# Patient Record
Sex: Female | Born: 1955 | Race: White | Hispanic: No | Marital: Married | Smoking: Never smoker
Health system: Southern US, Community
[De-identification: ages and names within clinical notes are randomized; demographics above are authoritative.]

## PROBLEM LIST (undated history)

## (undated) DIAGNOSIS — I10 Essential (primary) hypertension: Secondary | ICD-10-CM

## (undated) DIAGNOSIS — E119 Type 2 diabetes mellitus without complications: Secondary | ICD-10-CM

## (undated) DIAGNOSIS — E785 Hyperlipidemia, unspecified: Secondary | ICD-10-CM

## (undated) HISTORY — PX: ABDOMINOPLASTY: SUR9

## (undated) HISTORY — PX: ABDOMINAL HYSTERECTOMY: SHX81

## (undated) HISTORY — PX: KNEE SURGERY: SHX244

---

## 2009-03-22 ENCOUNTER — Ambulatory Visit: Payer: Self-pay | Admitting: Family Medicine

## 2009-03-22 DIAGNOSIS — M546 Pain in thoracic spine: Secondary | ICD-10-CM | POA: Insufficient documentation

## 2009-03-22 DIAGNOSIS — I10 Essential (primary) hypertension: Secondary | ICD-10-CM | POA: Insufficient documentation

## 2009-03-22 DIAGNOSIS — E109 Type 1 diabetes mellitus without complications: Secondary | ICD-10-CM | POA: Insufficient documentation

## 2009-03-22 DIAGNOSIS — M542 Cervicalgia: Secondary | ICD-10-CM | POA: Insufficient documentation

## 2009-03-22 DIAGNOSIS — E785 Hyperlipidemia, unspecified: Secondary | ICD-10-CM | POA: Insufficient documentation

## 2009-04-02 ENCOUNTER — Ambulatory Visit: Payer: Self-pay | Admitting: Family Medicine

## 2009-04-02 DIAGNOSIS — J40 Bronchitis, not specified as acute or chronic: Secondary | ICD-10-CM | POA: Insufficient documentation

## 2009-06-10 ENCOUNTER — Ambulatory Visit: Payer: Self-pay | Admitting: Family Medicine

## 2009-06-10 DIAGNOSIS — R42 Dizziness and giddiness: Secondary | ICD-10-CM | POA: Insufficient documentation

## 2009-06-10 DIAGNOSIS — R110 Nausea: Secondary | ICD-10-CM | POA: Insufficient documentation

## 2009-06-10 DIAGNOSIS — J019 Acute sinusitis, unspecified: Secondary | ICD-10-CM | POA: Insufficient documentation

## 2009-06-10 DIAGNOSIS — G4730 Sleep apnea, unspecified: Secondary | ICD-10-CM | POA: Insufficient documentation

## 2010-02-12 ENCOUNTER — Ambulatory Visit
Admission: RE | Admit: 2010-02-12 | Discharge: 2010-02-12 | Payer: Self-pay | Source: Home / Self Care | Admitting: Emergency Medicine

## 2010-02-16 NOTE — Letter (Signed)
Summary: Out of Work  [facility]  [address]   [place], [place] [address]   Phone: [phone]  Fax: [phone]    Jun 10, 2009   Employee:  Cyndi SHONTEL SANTEE    To Whom It May Concern:   For Medical reasons, please excuse the above named employee from work for the following dates:  Start:   06/10/2009  Return:   06/13/2009  If you need additional information, please feel free to contact our office.         Sincerely,    Hassan Rowan MD

## 2010-02-16 NOTE — Assessment & Plan Note (Signed)
Summary: COUGHING IS PERSISTANT/WHEEZING   Vital Signs:  Patient Profile:   55 Years Old Female CC:      runny nose, productive cough X 1 week, chills X 4 days and SOB at rest and with exertion  Height:     64.5 inches Weight:      200 pounds O2 Sat:      99 % O2 treatment:    Room Air Temp:     99.9 degrees F oral Pulse rate:   98 / minute Pulse rhythm:   regular Resp:     16 per minute BP sitting:   141 / 94  (right arm)  Pt. in pain?   no  Vitals Entered By: Lajean Saver RN (April 02, 2009 4:09 PM)                   Updated Prior Medication List: HYDROCHLOROTHIAZIDE 25 MG TABS (HYDROCHLOROTHIAZIDE) 1 tab by mouth once daily METFORMIN HCL 500 MG TABS (METFORMIN HCL) 2 tabs by mouth once daily GLIPIZIDE 2.5 MG XR24H-TAB (GLIPIZIDE) 1 tab by mouth three times a day SIMVASTATIN 40 MG TABS (SIMVASTATIN) 1 tab by mouth once daily LORTAB 5 5-500 MG TABS (HYDROCODONE-ACETAMINOPHEN) One or two tabs by mouth hs as needed pain LISINOPRIL 5 MG TABS (LISINOPRIL) once daily MUCINEX DM 30-600 MG XR12H-TAB (DEXTROMETHORPHAN-GUAIFENESIN) taken as needed X 3 days  Current Allergies (reviewed today): ! PENICILLIN ! CODEINEHistory of Present Illness Chief Complaint: runny nose, productive cough X 1 week, chills X 4 days and SOB at rest and with exertion  History of Present Illness: ONSET A WK AGO WITH RUNNY NOSE AND PND. INITIALLY A NP CROUPY COUGH THAT HAS NOW BECOME PRODUCTIVE. ?FEVER, HAS CHILLS. HX OF PNEUMONIZ IN THE PAST X 2. TAKING OTC MEDICATION.   REVIEW OF SYSTEMS Constitutional Symptoms       Complains of chills.     Denies fever, night sweats, weight loss, weight gain, and fatigue.  Eyes       Denies change in vision, eye pain, eye discharge, glasses, contact lenses, and eye surgery. Ear/Nose/Throat/Mouth       Complains of frequent nose bleeds.      Denies hearing loss/aids, change in hearing, ear pain, ear discharge, dizziness, frequent runny nose, sinus problems, sore  throat, hoarseness, and tooth pain or bleeding.  Respiratory       Complains of productive cough and shortness of breath.      Denies dry cough, wheezing, asthma, bronchitis, and emphysema/COPD.  Cardiovascular       Denies murmurs, chest pain, and tires easily with exhertion.    Gastrointestinal       Denies stomach pain, nausea/vomiting, diarrhea, constipation, blood in bowel movements, and indigestion. Genitourniary       Denies painful urination, kidney stones, and loss of urinary control. Neurological       Denies paralysis, seizures, and fainting/blackouts. Musculoskeletal       Denies muscle pain, joint pain, joint stiffness, decreased range of motion, redness, swelling, muscle weakness, and gout.  Skin       Denies bruising, unusual mles/lumps or sores, and hair/skin or nail changes.  Psych       Denies mood changes, temper/anger issues, anxiety/stress, speech problems, depression, and sleep problems. Other Comments: Taken mucinex DM X 3 days and Zyrtec   Past History:  Past Medical History: Reviewed history from 03/22/2009 and no changes required. Diabetes mellitus, type I Hyperlipidemia Hypertension Sleep Apnea  Past Surgical History: Reviewed history  from 03/22/2009 and no changes required. Caesarean section Hysterectomy Tummy Tuck  Family History: Reviewed history from 03/22/2009 and no changes required. Family History of CAD Female 1st degree relative <60 Family History High cholesterol Family History Hypertension Family History Lung cancer Family History of Stroke M 1st degree relative <50  Social History: Never Smoked Alcohol use-no Drug use-no Regular exercise-no Occupation: Runner, broadcasting/film/video Married Physical Exam General appearance: well developed, well nourished, no acute distress Ears: normal, no lesions or deformities Nasal: mucosa pink, nonedematous, no septal deviation, turbinates normal Neck: neck supple,  trachea midline, no masses Chest/Lungs: no  rales, wheezes, or rhonchi bilateral, breath sounds equal without effort. CROUPY COUGH Heart: regular rate and  rhythm, no murmur Extremities: normal extremities Skin: no obvious rashes or lesions Assessment New Problems: BRONCHITIS (ICD-490)   Plan New Medications/Changes: HYDROCODONE-ACETAMINOPHEN 5-500 MG TABS (HYDROCODONE-ACETAMINOPHEN) 1/2 TO 1 TAB by mouth Q 6-8 HRS as needed COUGH  ##15 x 0, 04/02/2009, Mahdi Frye DO ZITHROMAX Z-PAK 250 MG TABS (AZITHROMYCIN) TAKE AS DIRECTED  #1 PK x 0, 04/02/2009, Marvis Moeller DO  New Orders: Est. Patient Level III [35573]   Prescriptions: HYDROCODONE-ACETAMINOPHEN 5-500 MG TABS (HYDROCODONE-ACETAMINOPHEN) 1/2 TO 1 TAB by mouth Q 6-8 HRS as needed COUGH  ##15 x 0   Entered and Authorized by:   Marvis Moeller DO   Signed by:   Marvis Moeller DO on 04/02/2009   Method used:   Print then Give to Patient   RxID:   [id number] ZITHROMAX Z-PAK 250 MG TABS (AZITHROMYCIN) TAKE AS DIRECTED  #1 PK x 0   Entered and Authorized by:   Marvis Moeller DO   Signed by:   Marvis Moeller DO on 04/02/2009   Method used:   Print then Give to Patient   RxID:   [id number]   Patient Instructions: 1)  TYLENOL OR MOTRIN AS NEEDED. AVOID CAFFEINE AND MILK PRODUCTS. FOLLOW UP WITH YOUR PCP OR RETURN IF SYMPTOMS PERSIST OR WORSEN.

## 2010-02-16 NOTE — Letter (Signed)
Summary: Out of Work  [facility]  [address]   [place], [place] [address]   Phone: [phone]  Fax: [phone]    April 02, 2009   Employee:  Katherine Luna    To Whom It May Concern:   For Medical reasons, please excuse the above named employee from work for the following dates:  Start:   02 Apr 2009  End:   05 Apr 2009  If you need additional information, please feel free to contact our office.         Sincerely,    Marvis Moeller DO

## 2010-02-16 NOTE — Assessment & Plan Note (Signed)
Summary: Dizziness, eye pain, sinus pressure x 1 wk rm 2   Vital Signs:  Patient Profile:   55 Years Old Female CC:      Cold & URI symptoms Height:     64.5 inches Weight:      202 pounds O2 Sat:      100 % O2 treatment:    Room Air Temp:     97.7 degrees F oral Pulse rate:   78 / minute Pulse rhythm:   regular Resp:     16 per minute BP sitting:   124 / 82  (right arm) Cuff size:   regular  Vitals Entered By: Areta Haber CMA (Jun 10, 2009 7:08 PM)                  Prior Medication List:  HYDROCHLOROTHIAZIDE 25 MG TABS (HYDROCHLOROTHIAZIDE) 1 tab by mouth once daily METFORMIN HCL 500 MG TABS (METFORMIN HCL) 2 tabs by mouth once daily GLIPIZIDE 2.5 MG XR24H-TAB (GLIPIZIDE) 1 tab by mouth three times a day SIMVASTATIN 40 MG TABS (SIMVASTATIN) 1 tab by mouth once daily LORTAB 5 5-500 MG TABS (HYDROCODONE-ACETAMINOPHEN) One or two tabs by mouth hs as needed pain LISINOPRIL 5 MG TABS (LISINOPRIL) once daily MUCINEX DM 30-600 MG XR12H-TAB (DEXTROMETHORPHAN-GUAIFENESIN) taken as needed X 3 days ZITHROMAX Z-PAK 250 MG TABS (AZITHROMYCIN) TAKE AS DIRECTED HYDROCODONE-ACETAMINOPHEN 5-500 MG TABS (HYDROCODONE-ACETAMINOPHEN) 1/2 TO 1 TAB by mouth Q 6-8 HRS as needed COUGH   Current Allergies (reviewed today): ! PENICILLIN ! CODEINE   History of Present Illness Chief Complaint: Cold & URI symptoms History of Present Illness: Patient started having dizzzyness today this AM. she reports having vertigo 11 months ago. She has been taking her antivert which has helped to some degree. She states she has been having sinus drainage and puffiness abiout her face as well.   Current Problems: SLEEP APNEA, CHRONIC (ICD-780.57) NAUSEA ALONE (ICD-787.02) VERTIGO (ICD-780.4) ACUTE SINUSITIS, UNSPECIFIED (ICD-461.9) BRONCHITIS (ICD-490) BACK PAIN, THORACIC REGION, LEFT (ICD-724.1) NECK PAIN, ACUTE (ICD-723.1) FAMILY HISTORY OF CAD FEMALE 1ST DEGREE RELATIVE <60  (ICD-V16.49) HYPERTENSION (ICD-401.9) HYPERLIPIDEMIA (ICD-272.4) DIABETES MELLITUS, TYPE I (ICD-250.01)   Current Meds HYDROCHLOROTHIAZIDE 25 MG TABS (HYDROCHLOROTHIAZIDE) 1 tab by mouth once daily METFORMIN HCL 500 MG TABS (METFORMIN HCL) 2 tabs by mouth once daily GLIPIZIDE 2.5 MG XR24H-TAB (GLIPIZIDE) 1 tab by mouth three times a day SIMVASTATIN 40 MG TABS (SIMVASTATIN) 1 tab by mouth once daily LORTAB 5 5-500 MG TABS (HYDROCODONE-ACETAMINOPHEN) One or two tabs by mouth hs as needed pain LISINOPRIL 5 MG TABS (LISINOPRIL) once daily MUCINEX DM 30-600 MG XR12H-TAB (DEXTROMETHORPHAN-GUAIFENESIN) taken as needed X 3 days MECLIZINE HCL 25 MG TABS (MECLIZINE HCL) 1 tab q 4 hrs prns CEFUROXIME AXETIL 500 MG  TABS (CEFUROXIME AXETIL) 1 by mouth 2 times daily * NASONEX OR FLONASE NASAL SPRAY sig 2 puffs each nostril QDay PROMETHAZINE HCL 25 MG  TABS (PROMETHAZINE HCL) sig 1 by mouth q6-8hrs as needed for nausea MUCINEX D [phone] MG XR12H-TAB (PSEUDOEPHEDRINE-GUAIFENESIN) sig 1 by mouth twice a day as needed for nassal congestion MECLIZINE HCL 25 MG TABS (MECLIZINE HCL) 1 by mouth two three times a day as needed vertigo  REVIEW OF SYSTEMS Constitutional Symptoms      Denies fever, chills, night sweats, weight loss, weight gain, and fatigue.  Eyes       Complains of eye pain.      Denies change in vision, eye discharge, glasses, contact lenses, and eye surgery. Ear/Nose/Throat/Mouth  Complains of ear pain, dizziness, and sinus problems.      Denies hearing loss/aids, change in hearing, ear discharge, frequent runny nose, frequent nose bleeds, sore throat, hoarseness, and tooth pain or bleeding.      Comments: x 1 wk Respiratory       Complains of dry cough.      Denies productive cough, wheezing, shortness of breath, asthma, bronchitis, and emphysema/COPD.  Cardiovascular       Denies murmurs, chest pain, and tires easily with exhertion.    Gastrointestinal       Denies stomach pain,  nausea/vomiting, diarrhea, constipation, blood in bowel movements, and indigestion. Genitourniary       Denies painful urination, kidney stones, and loss of urinary control. Neurological       Denies paralysis, seizures, and fainting/blackouts. Musculoskeletal       Denies muscle pain, joint pain, joint stiffness, decreased range of motion, redness, swelling, muscle weakness, and gout.  Skin       Denies bruising, unusual mles/lumps or sores, and hair/skin or nail changes.  Psych       Denies mood changes, temper/anger issues, anxiety/stress, speech problems, depression, and sleep problems. Other Comments: Pt states dizziness started this am. Pt has not seen PCP for this.    Past History:  Past Medical History: Last updated: 03/22/2009 Diabetes mellitus, type I Hyperlipidemia Hypertension Sleep Apnea  Past Surgical History: Last updated: 03/22/2009 Caesarean section Hysterectomy Tummy Tuck  Family History: Last updated: 03/22/2009 Family History of CAD Female 1st degree relative <60 Family History High cholesterol Family History Hypertension Family History Lung cancer Family History of Stroke M 1st degree relative <50  Social History: Last updated: 04/02/2009 Never Smoked Alcohol use-no Drug use-no Regular exercise-no Occupation: teacher Married  Risk Factors: Exercise: no (03/22/2009)  Risk Factors: Smoking Status: never (03/22/2009)  Family History: Reviewed history from 03/22/2009 and no changes required. Family History of CAD Female 1st degree relative <60 Family History High cholesterol Family History Hypertension Family History Lung cancer Family History of Stroke M 1st degree relative <50  Social History: Reviewed history from 04/02/2009 and no changes required. Never Smoked Alcohol use-no Drug use-no Regular exercise-no Occupation: Runner, broadcasting/film/video Married Physical Exam General appearance: well developed, well nourished,mild  distress Head:  normocephalic, atraumatic Ears: fluid noted without inflammation right TM Nasal: swollen red turbinates with congestion tenderness over the sinuses Oral/Pharynx: pharyngeal erythema without exudate, uvula midline without deviation Neck: supple,anterior lymphadenopathy present Skin: no obvious rashes or lesions MSE: oriented to time, place, and person Assessment New Problems: SLEEP APNEA, CHRONIC (ICD-780.57) NAUSEA ALONE (ICD-787.02) VERTIGO (ICD-780.4) ACUTE SINUSITIS, UNSPECIFIED (ICD-461.9)  sinusitis   vertigo  Patient Education: Patient and/or caregiver instructed in the following: rest fluids and Tylenol.  Plan New Medications/Changes: MECLIZINE HCL 25 MG TABS (MECLIZINE HCL) 1 by mouth two three times a day as needed vertigo  #20 x 0, 06/10/2009, Hassan Rowan MD MUCINEX D [phone] MG XR12H-TAB (PSEUDOEPHEDRINE-GUAIFENESIN) sig 1 by mouth twice a day as needed for nassal congestion  #30 x 0, 06/10/2009, Hassan Rowan MD PROMETHAZINE HCL 25 MG  TABS (PROMETHAZINE HCL) sig 1 by mouth q6-8hrs as needed for nausea  #12 x 0, 06/10/2009, Hassan Rowan MD NASONEX OR [facility] NASAL SPRAY sig 2 puffs each nostril QDay  #1 x 0, 06/10/2009, Hassan Rowan MD CEFUROXIME AXETIL 500 MG  TABS (CEFUROXIME AXETIL) 1 by mouth 2 times daily  #20 x 0, 06/10/2009, Hassan Rowan MD  New Orders: Est. Patient Level III [phone] Planning  Comments:   wil treat for eustatian tube dysfunction and sinusitis  Follow Up: Follow up in 2-3 days if no improvement, Follow up with Primary Physician Work/School Excuse: Return to work/school in 3 days  The patient and/or caregiver has been counseled thoroughly with regard to medications prescribed including dosage, schedule, interactions, rationale for use, and possible side effects and they verbalize understanding.  Diagnoses and expected course of recovery discussed and will return if not improved as expected or if the condition worsens. Patient and/or caregiver verbalized  understanding.  Prescriptions: MECLIZINE HCL 25 MG TABS (MECLIZINE HCL) 1 by mouth two three times a day as needed vertigo  #20 x 0   Entered and Authorized by:   Hassan Rowan MD   Signed by:   Hassan Rowan MD on 06/10/2009   Method used:   Printed then faxed to ...       [facility]  [address] [phone]* (retail)       [address] [place], [place]  [address]       Ph: [phone] or [phone]       Fax: [phone]   RxID:   [id number] MUCINEX D [phone] MG XR12H-TAB (PSEUDOEPHEDRINE-GUAIFENESIN) sig 1 by mouth twice a day as needed for nassal congestion  #30 x 0   Entered and Authorized by:   Hassan Rowan MD   Signed by:   Hassan Rowan MD on 06/10/2009   Method used:   Printed then faxed to ...       [facility]  [address] [phone]* (retail)       [address] [place], [place]  [address]       Ph: [phone] or [phone]       Fax: [phone]   RxID:   [id number] PROMETHAZINE HCL 25 MG  TABS (PROMETHAZINE HCL) sig 1 by mouth q6-8hrs as needed for nausea  #12 x 0   Entered and Authorized by:   Hassan Rowan MD   Signed by:   Hassan Rowan MD on 06/10/2009   Method used:   Printed then faxed to ...       [facility]  [address] [phone]* (retail)       [address] [place], [place]  [address]       Ph: [phone] or [phone]       Fax: [phone]   RxID:   [id number] NASONEX OR FLONASE NASAL SPRAY sig 2 puffs each nostril QDay  #1 x 0   Entered and Authorized by:   Hassan Rowan MD   Signed by:   Hassan Rowan MD on 06/10/2009   Method used:   Printed then faxed to ...       [facility]  [address] [phone]* (retail)       [address] [place], [place]  [address]       Ph: [phone] or [phone]       Fax: [phone]   RxID:   [id number] CEFUROXIME AXETIL 500 MG  TABS (CEFUROXIME AXETIL) 1 by mouth 2 times daily  #20 x 0   Entered and Authorized by:   Hassan Rowan MD   Signed by:   Hassan Rowan MD on 06/10/2009   Method used:   Printed then faxed  to ...       [facility]  [address] [phone]* (retail)  [address]       [place], [place]  [address]       Ph: [phone] or [phone]       Fax: [phone]   RxID:   [id number]   Patient Instructions: 1)  Please schedule an appointment with your primary doctor in : 2)   a follow-up appointment in3-14 days if needed 3)  Take your antibiotic as prescribed until ALL of it is gone, but stop if you develop a rash or swelling and contact our office as soon as possible. 4)  Acute sinusitis symptoms for less than 10 days are not helped by antibiotics.Use warm moist compresses, and over the counter decongestants ( only as directed). Call if no improvement in 5-7 days, sooner if increasing pain, fever, or new symptoms. 5)  Recommended remaining out of work until 06/13/2009

## 2010-02-16 NOTE — Assessment & Plan Note (Signed)
Summary: Shoulder/neck pain x 3 weeks rm 3   Vital Signs:  Patient Profile:   55 Years Old Female CC:      Shoulder, neck pain- Left x 3 wks Height:     64.5 inches Weight:      200 pounds O2 Sat:      100 % O2 treatment:    Room Air Temp:     98.0 degrees F oral Pulse rate:   70 / minute Pulse rhythm:   regular Resp:     16 per minute BP sitting:   129 / 81  (right arm) Cuff size:   regular  Vitals Entered By: Areta Haber CMA (March 22, 2009 4:42 PM)                  Current Allergies (reviewed today): ! PENICILLIN ! CODEINE   History of Present Illness Chief Complaint: Shoulder, neck pain- Left x 3 wks History of Present Illness: Subjective:  Patient complains of onset of posterior neck pain about 3 weeks ago while lifting weights with a weight lifting achine.  Since then she has developed increasing pain in her left shoulder/scapular area, worse at night.  She has also developed intermittent tingling in her left arm but no loss of strength.  Current Problems: BACK PAIN, THORACIC REGION, LEFT (ICD-724.1) NECK PAIN, ACUTE (ICD-723.1) FAMILY HISTORY OF CAD FEMALE 1ST DEGREE RELATIVE <60 (ICD-V16.49) HYPERTENSION (ICD-401.9) HYPERLIPIDEMIA (ICD-272.4) DIABETES MELLITUS, TYPE I (ICD-250.01)   Current Meds HYDROCHLOROTHIAZIDE 25 MG TABS (HYDROCHLOROTHIAZIDE) 1 tab by mouth once daily METFORMIN HCL 500 MG TABS (METFORMIN HCL) 2 tabs by mouth once daily GLIPIZIDE 2.5 MG XR24H-TAB (GLIPIZIDE) 1 tab by mouth three times a day SIMVASTATIN 40 MG TABS (SIMVASTATIN) 1 tab by mouth once daily NAPROXEN 500 MG TABS (NAPROXEN) One by mouth two times a day pc LORTAB 5 5-500 MG TABS (HYDROCODONE-ACETAMINOPHEN) One or two tabs by mouth hs as needed pain  REVIEW OF SYSTEMS Constitutional Symptoms      Denies fever, chills, night sweats, weight loss, weight gain, and fatigue.  Eyes       Denies change in vision, eye pain, eye discharge, glasses, contact lenses, and eye  surgery. Ear/Nose/Throat/Mouth       Denies hearing loss/aids, change in hearing, ear pain, ear discharge, dizziness, frequent runny nose, frequent nose bleeds, sinus problems, sore throat, hoarseness, and tooth pain or bleeding.  Respiratory       Denies dry cough, productive cough, wheezing, shortness of breath, asthma, bronchitis, and emphysema/COPD.  Cardiovascular       Denies murmurs, chest pain, and tires easily with exhertion.    Gastrointestinal       Denies stomach pain, nausea/vomiting, diarrhea, constipation, blood in bowel movements, and indigestion. Genitourniary       Denies painful urination, kidney stones, and loss of urinary control. Neurological       Denies paralysis, seizures, and fainting/blackouts. Musculoskeletal       Denies muscle pain, joint pain, joint stiffness, decreased range of motion, redness, swelling, muscle weakness, and gout.  Skin       Denies bruising, unusual mles/lumps or sores, and hair/skin or nail changes.  Psych       Denies mood changes, temper/anger issues, anxiety/stress, speech problems, depression, and sleep problems. Other Comments: Shoulder/neck pain x 3 weeks. Pt has not seen PCP for this.,   Past History:  Past Medical History: Diabetes mellitus, type I Hyperlipidemia Hypertension Sleep Apnea  Past Surgical History: Caesarean section  Hysterectomy Tummy Tuck  Family History: Family History of CAD Female 1st degree relative <60 Family History High cholesterol Family History Hypertension Family History Lung cancer Family History of Stroke M 1st degree relative <50  Social History: Never Smoked Alcohol use-no Drug use-no Regular exercise-no Smoking Status:  never Drug Use:  no Does Patient Exercise:  no   Objective:  No acute distress  Neck:  mildly decreased range of motion.  Tender centrally over spinous processes of neck.  Mild tenderness over left trapezius muscle Left shoulder:  full range of motion.  No  significant tenderness Left scapula:  tenderness along medial border Lungs:  Clear to auscultation.  Breath sounds are equal.  Heart:  Regular rate and rhythm without murmurs, rubs, or gallops.  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 CERVICAL DISC DISEASE  Plan New Medications/Changes: LORTAB 5 5-500 MG TABS (HYDROCODONE-ACETAMINOPHEN) One or two tabs by mouth hs as needed pain  #12 (twelve) x 0, 03/22/2009, Donna Christen MD NAPROXEN 500 MG TABS (NAPROXEN) One by mouth two times a day pc  #20 x 1, 03/22/2009, Donna Christen MD  New Orders: T-Cervical Spine Comp 4 Views [72050TC] Cervical Foam Collar Non Adjustable [L0120] New Patient Level III [76160] Planning Comments:   Switch to Naproxen.  Analgesic for bedtime.  Dispensed soft C-collar.  Begin range of motion exercises for rhomboid strain ([employer] information and instruction patient handout given)  Continue ice packs several times daily. Given info sheet on cervical disc syndrome. Follow-up with PCP if not improving 2 weeks; may need MRI of C-spine.    The patient and/or caregiver has been counseled thoroughly with regard to medications prescribed including dosage, schedule, interactions, rationale for use, and possible side effects and they verbalize understanding.  Diagnoses and expected course of recovery discussed and will return if not improved as expected or if the condition worsens. Patient and/or caregiver verbalized understanding.  Prescriptions: LORTAB 5 5-500 MG TABS (HYDROCODONE-ACETAMINOPHEN) One or two tabs by mouth hs as needed pain  #12 (twelve) x 0   Entered and Authorized by:   Donna Christen MD   Signed by:   Donna Christen MD on  03/22/2009   Method used:   Print then Give to Patient   RxID:   [id number] NAPROXEN 500 MG TABS (NAPROXEN) One by mouth two times a day pc  #20 x 1   Entered and Authorized by:   Donna Christen MD   Signed by:   Donna Christen MD on 03/22/2009   Method used:   Print then Give to Patient   RxID:   [id number]

## 2010-02-18 NOTE — Assessment & Plan Note (Signed)
Summary: POSSIBLE  EAR INFECTION BOTH EARS? N room 4   Vital Signs:  Patient Profile:   55 Years Old Female CC:      bilateral ear ache/vertigo Height:     64.5 inches Weight:      197.50 pounds O2 Sat:      98 % O2 treatment:    Room Air Temp:     98.5 degrees F oral Pulse rate:   73 / minute Resp:     16 per minute BP sitting:   122 / 61  (left arm) Cuff size:   regular  Vitals Entered By: Clemens Catholic LPN (February 12, 2010 9:04 AM)                  Updated Prior Medication List: HYDROCHLOROTHIAZIDE 25 MG TABS (HYDROCHLOROTHIAZIDE) 1 tab by mouth once daily METFORMIN HCL 500 MG TABS (METFORMIN HCL) 2 tabs by mouth once daily GLIPIZIDE 2.5 MG XR24H-TAB (GLIPIZIDE) 1 tab by mouth three times a day SIMVASTATIN 40 MG TABS (SIMVASTATIN) 1 tab by mouth once daily LISINOPRIL 5 MG TABS (LISINOPRIL) once daily CITALOPRAM HYDROBROMIDE 10 MG TABS (CITALOPRAM HYDROBROMIDE)   Current Allergies (reviewed today): ! PENICILLIN ! CODEINEHistory of Present Illness Chief Complaint: bilateral ear ache/vertigo History of Present Illness: 55 Years Old Female complains of onset of cold symptoms for 1 days.  Taneal has been using Meclizine and Flonase which is helping a little bit.  She has had Vertigo before and this feels like it.  She is diabetic and her BS this morning was 170.  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 + dizziness (room spinning) No confusion, SOB, CP, weakness, numbness, slurred speech No sore throat No cough No pleuritic pain No wheezing No nasal congestion No post-nasal drainage + sinus pain/pressure (left sided) No chest congestion No itchy/red eyes + bilateral earache No hemoptysis No SOB No chills/sweats No fever No nausea No vomiting No abdominal pain No diarrhea No skin rashes + fatigue + headache   REVIEW OF SYSTEMS Constitutional Symptoms      Denies fever, chills, night sweats, weight loss, weight gain, and fatigue.  Eyes   Denies change in vision, eye pain, eye discharge, glasses, contact lenses, and eye surgery. Ear/Nose/Throat/Mouth       Complains of ear pain.      Denies hearing loss/aids, change in hearing, ear discharge, dizziness, frequent runny nose, frequent nose bleeds, sinus problems, sore throat, hoarseness, and tooth pain or bleeding.  Respiratory       Denies dry cough, productive cough, wheezing, shortness of breath, asthma, bronchitis, and emphysema/COPD.  Cardiovascular       Denies murmurs, chest pain, and tires easily with exhertion.    Gastrointestinal       Denies stomach pain, nausea/vomiting, diarrhea, constipation, blood in bowel movements, and indigestion. Genitourniary       Denies painful urination, kidney stones, and loss of urinary control. Neurological       Complains of headaches.      Denies paralysis, seizures, and fainting/blackouts. Musculoskeletal       Denies muscle pain, joint pain, joint stiffness, decreased range of motion, redness, swelling, muscle weakness, and gout.  Skin       Denies bruising, unusual mles/lumps or sores, and hair/skin or nail changes.  Psych       Denies mood changes, temper/anger issues, anxiety/stress, speech problems, depression, and sleep problems. Other Comments: pt c/o vertigo,  bilateral ear ache,  nasal  congestion, ears feel itchy, and HA off and on. x this AM. Her FBS this AM was 170 (she ate supper late last night). she has taken fluticasone and Meclizine this AM.   Past History:  Past Medical History: Reviewed history from 03/22/2009 and no changes required. Diabetes mellitus, type I Hyperlipidemia Hypertension Sleep Apnea  Past Surgical History: Reviewed history from 03/22/2009 and no changes required. Caesarean section Hysterectomy Tummy Tuck  Family History: Reviewed history from 03/22/2009 and no changes required. Family History of CAD Female 1st degree relative <60 Family History High cholesterol Family History  Hypertension Family History Lung cancer Family History of Stroke M 1st degree relative <50  Social History: Reviewed history from 04/02/2009 and no changes required. Never Smoked Alcohol use-no Drug use-no Regular exercise-no Occupation: teacher Married Physical Exam General appearance: well developed, well nourished, no acute distress Eyes: PERRLA EOMI, horizontal nystagmus mild with lateral deviation Ears: normal, no lesions or deformities, mild cerumen bilaterally Nasal: mucosa pink, nonedematous, no septal deviation, turbinates normal Oral/Pharynx: tongue normal, posterior pharynx without erythema or exudate Chest/Lungs: no rales, wheezes, or rhonchi bilateral, breath sounds equal without effort Heart: regular rate and  rhythm, no murmur Neurological: CN2-12 grossly intact, no facial drooping, normal gait MSE: oriented to time, place, and person Assessment New Problems: VERTIGO (ICD-780.4)   Patient Education: Patient and/or caregiver instructed in the following: rest, fluids. Demonstrates willingness to comply.  Plan New Medications/Changes: AMOXICILLIN 875 MG TABS (AMOXICILLIN) 1 by mouth two times a day for 7 days  #14 x 0, 02/12/2010, Hoyt Koch MD  New Orders: Est. Patient Level III [phone] Planning Comments:   Rest, hydration, no reading or rapid eye/head movement today.  Continue your Meclizine Q6 hrs as needed.  Continue Flonase daily.  Continue CPAP.  Check your BS at home.  If not improving or having further symptoms (fever, facial pain), take the antibiotic.  As of now, this is likely mild viral URI with vertigo so no antibiotic needed.   The patient and/or caregiver has been counseled thoroughly with regard to medications prescribed including dosage, schedule, interactions, rationale for use, and possible side effects and they verbalize understanding.  Diagnoses and expected course of recovery discussed and will return if not improved as expected or if  the condition worsens. Patient and/or caregiver verbalized understanding.  Prescriptions: AMOXICILLIN 875 MG TABS (AMOXICILLIN) 1 by mouth two times a day for 7 days  #14 x 0   Entered and Authorized by:   Hoyt Koch MD   Signed by:   Hoyt Koch MD on 02/12/2010   Method used:   Print then Give to Patient   RxID:   [id number]   Orders Added: 1)  Est. Patient Level III [46962]

## 2011-11-09 ENCOUNTER — Encounter: Payer: Self-pay | Admitting: *Deleted

## 2011-11-09 ENCOUNTER — Emergency Department
Admission: EM | Admit: 2011-11-09 | Discharge: 2011-11-09 | Disposition: A | Discharge status: Home / Self Care | Payer: BC Managed Care – PPO | Source: Home / Self Care | Attending: Family Medicine | Admitting: Family Medicine

## 2011-11-09 DIAGNOSIS — J069 Acute upper respiratory infection, unspecified: Secondary | ICD-10-CM

## 2011-11-09 DIAGNOSIS — J029 Acute pharyngitis, unspecified: Secondary | ICD-10-CM

## 2011-11-09 HISTORY — DX: Hyperlipidemia, unspecified: E78.5

## 2011-11-09 HISTORY — DX: Type 2 diabetes mellitus without complications: E11.9

## 2011-11-09 HISTORY — DX: Essential (primary) hypertension: I10

## 2011-11-09 MED ORDER — AZITHROMYCIN 250 MG PO TABS: ORAL_TABLET | ORAL | Status: DC

## 2011-11-09 MED ORDER — BENZONATATE 200 MG PO CAPS: 200.0000 mg | ORAL_CAPSULE | Freq: Every day | ORAL | Status: DC

## 2011-11-09 NOTE — ED Notes (Signed)
Patient c/o sore throat and dry cough for 3 days. 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

## 2011-11-09 NOTE — ED Provider Notes (Signed)
History     CSN: [id number]  Arrival date & time 11/09/11  1708   First MD Initiated Contact with Patient 11/09/11 1724      Chief Complaint  Patient presents with  . Sore Throat      HPI Comments: Patient complains of 3 day history of gradually progressive URI symptoms beginning with a sore throat (now improved), followed by progressive nasal congestion and a cough.  Complains of fatigue and initial myalgias.  Cough is now worse at night and generally non-productive during the day.  There has been no pleuritic pain, shortness of breath, or wheezes.  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  The history is provided by the patient.    Past Medical History  Diagnosis Date  . Diabetes mellitus without complication   . Hypertension   . Hyperlipemia     Past Surgical History  Procedure Date  . Abdominal hysterectomy   . Knee surgery     Family History  Problem Relation Age of Onset  . Hypertension Mother   . Stroke Mother   . Cancer Mother   . Heart attack Father     History  Substance Use Topics  . Smoking status: Never Smoker   . Smokeless tobacco: Never Used  . Alcohol Use: No    OB History    Grav Para Term Preterm Abortions TAB SAB Ect Mult Living                  Review of Systems + sore throat + cough No pleuritic pain No wheezing + nasal congestion + post-nasal drainage No sinus pain/pressure No itchy/red eyes No earache No hemoptysis No SOB No fever, + chills No nausea No vomiting No abdominal pain No diarrhea No urinary symptoms No skin rashes + fatigue + myalgias + headache Used OTC meds without relief  Allergies  Codeine; Eggs or egg-derived products; Morphine and related; and Penicillins  Home Medications   Current Outpatient Rx  Name Route Sig Dispense Refill  . ASPIRIN 81 MG PO TABS Oral Take 81 mg by mouth daily.    Marland Kitchen GLIMEPIRIDE PO Oral Take by mouth.    Marland Kitchen LISINOPRIL 5 MG PO TABS  Oral Take 5 mg by mouth daily.    Marland Kitchen METFORMIN HCL 500 MG PO TABS Oral Take 500 mg by mouth 2 (two) times daily with a meal.    . SIMVASTATIN 40 MG PO TABS Oral Take 40 mg by mouth every evening.    . AZITHROMYCIN 250 MG PO TABS  Take 2 tabs today; then begin one tab once daily for 4 more days (Rx void after 11/17/11) 6 each 0  . BENZONATATE 200 MG PO CAPS Oral Take 1 capsule (200 mg total) by mouth at bedtime. Take as needed for cough 12 capsule 0    BP 178/109  Pulse 88  Temp 98.2 F (36.8 C) (Oral)  Resp 16  Ht 5' 4.5" (1.638 m)  Wt 205 lb 8 oz (93.214 kg)  BMI 34.73 kg/m2  SpO2 96%  Physical Exam Nursing notes and Vital Signs reviewed. Appearance:  Patient appears stated age, and in no acute distress.  Patient is obese (BMI 34.7) Eyes:  Pupils are equal, round, and reactive to light and accomodation.  Extraocular movement is intact.  Conjunctivae are not inflamed  Ears:  Canals normal.  Tympanic membranes normal.  Nose:  Mildly congested turbinates.  No sinus tenderness.  Pharynx:  Minimal erythema  Neck:  Supple.  Tender posterior nodes are palpated bilaterally  Lungs:  Clear to auscultation.  Breath sounds are equal.  Chest:  Distinct tenderness to palpation over the mid-sternum.  Heart:  Regular rate and rhythm without murmurs, rubs, or gallops.  Abdomen:  Nontender without masses or hepatosplenomegaly.  Bowel sounds are present.  No CVA or flank tenderness.  Extremities:  No edema.  No calf tenderness Skin:  No rash present.   ED Course  Procedures none   Labs Reviewed  POCT RAPID STREP A (OFFICE) negative      1. Acute pharyngitis   2. Acute upper respiratory infections of unspecified site        Note elevated BP    MDM  There is no evidence of bacterial infection today.   Treat symptomatically for now:  Prescription written for Benzonatate [facility]) to take at bedtime for night-time cough.  Take plain Mucinex (guaifenesin) twice daily for cough and  congestion.  Increase fluid intake, rest. May use Afrin nasal spray (or generic oxymetazoline) twice daily for about 5 days.  Also recommend using saline nasal spray several times daily and saline nasal irrigation (AYR is a common brand) Stop all antihistamines for now, and other non-prescription cough/cold preparations. May take Ibuprofen 200mg , 4 tabs every 8 hours with food for chest/sternum discomfort. Begin Azithromycin if not improving about 5 days or if persistent fever develops. Take blood pressure medicine this evening! Follow-up with family doctor if not improving 7 to 10 days.         Lattie Haw, MD 11/09/11 [phone]

## 2011-11-11 ENCOUNTER — Telehealth: Payer: Self-pay

## 2011-11-11 NOTE — ED Notes (Signed)
Left a message on voice mail asking how patient is feeling and advising to call back with any questions or concerns.  

## 2012-01-31 ENCOUNTER — Emergency Department (INDEPENDENT_AMBULATORY_CARE_PROVIDER_SITE_OTHER)
Admission: EM | Admit: 2012-01-31 | Discharge: 2012-01-31 | Disposition: A | Discharge status: Home / Self Care | Payer: BC Managed Care – PPO | Source: Home / Self Care | Attending: Family Medicine | Admitting: Family Medicine

## 2012-01-31 ENCOUNTER — Encounter: Payer: Self-pay | Admitting: *Deleted

## 2012-01-31 DIAGNOSIS — J029 Acute pharyngitis, unspecified: Secondary | ICD-10-CM

## 2012-01-31 DIAGNOSIS — Z0000 Encounter for general adult medical examination without abnormal findings: Secondary | ICD-10-CM

## 2012-01-31 DIAGNOSIS — R300 Dysuria: Secondary | ICD-10-CM

## 2012-01-31 LAB — POCT URINALYSIS DIP (MANUAL ENTRY)
Bilirubin, UA: NEGATIVE
Glucose, UA: 500
Nitrite, UA: NEGATIVE

## 2012-01-31 MED ORDER — BENZONATATE 200 MG PO CAPS: 200.0000 mg | ORAL_CAPSULE | Freq: Every day | ORAL | Status: DC

## 2012-01-31 MED ORDER — CEPHALEXIN 500 MG PO CAPS: 500.0000 mg | ORAL_CAPSULE | Freq: Three times a day (TID) | ORAL | Status: DC

## 2012-01-31 NOTE — ED Provider Notes (Addendum)
History     CSN: [id number]  Arrival date & time 01/31/12  1657   First MD Initiated Contact with Patient 01/31/12 1728      Chief Complaint  Patient presents with  . Sore Throat  . Urinary Frequency  . Dysuria     HPI Comments: Patient complains of mild URI symptoms for one week beginning with fatigue, myalgias, mild sore throat, and headache before developing sinus congestion and a cough.  She may have had a low grade fever.  During this time she has also developed dysuria, frequency, and hesitancy but no nocturia.  The history is provided by the patient.    Past Medical History  Diagnosis Date  . Diabetes mellitus without complication   . Hypertension   . Hyperlipemia     Past Surgical History  Procedure Date  . Abdominal hysterectomy   . Knee surgery   . Cesarean section   . Abdominoplasty     Family History  Problem Relation Age of Onset  . Hypertension Mother   . Stroke Mother   . Cancer Mother   . Heart attack Father   . Diabetes Father     History  Substance Use Topics  . Smoking status: Never Smoker   . Smokeless tobacco: Never Used  . Alcohol Use: No    OB History    Grav Para Term Preterm Abortions TAB SAB Ect Mult Living                  Review of Systems + sore throat + cough No pleuritic pain No wheezing + nasal congestion + post-nasal drainage No sinus pain/pressure No itchy/red eyes No earache No hemoptysis No SOB No fever, + chills No nausea No vomiting No abdominal pain No diarrhea + urinary symptoms:  Frequency, dysuria, hesitancy No skin rashes + fatigue + myalgias + headache Used OTC meds without relief  Allergies  Codeine; Eggs or egg-derived products; Morphine and related; and Penicillins  Home Medications   Current Outpatient Rx  Name  Route  Sig  Dispense  Refill  . COENZYME Q10 30 MG PO CAPS   Oral   Take 30 mg by mouth 3 (three) times daily.         . ASPIRIN 81 MG PO TABS   Oral   Take 81 mg by  mouth daily.         Marland Kitchen BENZONATATE 200 MG PO CAPS   Oral   Take 1 capsule (200 mg total) by mouth at bedtime. Take as needed for cough   12 capsule   0   . BENZONATATE 200 MG PO CAPS   Oral   Take 1 capsule (200 mg total) by mouth at bedtime. Take as needed for cough   12 capsule   0   . CEPHALEXIN 500 MG PO CAPS   Oral   Take 1 capsule (500 mg total) by mouth 3 (three) times daily.   30 capsule   0   . GLIMEPIRIDE PO   Oral   Take by mouth.         Marland Kitchen LISINOPRIL 5 MG PO TABS   Oral   Take 5 mg by mouth daily.         Marland Kitchen METFORMIN HCL 500 MG PO TABS   Oral   Take 500 mg by mouth 2 (two) times daily with a meal.         . SIMVASTATIN 40 MG PO TABS   Oral  Take 40 mg by mouth every evening.           BP 171/97  Pulse 84  Temp 98.3 F (36.8 C) (Oral)  Resp 18  Ht 5\' 5"  (1.651 m)  Wt 199 lb (90.266 kg)  BMI 33.12 kg/m2  SpO2 98%  Physical Exam Nursing notes and Vital Signs reviewed. Appearance:  Patient appears stated age, and in no acute distress.  Patient is obese (BMI 33.1) Eyes:  Pupils are equal, round, and reactive to light and accomodation.  Extraocular movement is intact.  Conjunctivae are not inflamed  Ears:  Canals normal.  Tympanic membranes normal.  Nose:  Mildly congested turbinates.  No sinus tenderness.  Pharynx:  Normal Neck:  Supple.  Slightly tender shotty anterior/posterior nodes are palpated bilaterally  Lungs:  Clear to auscultation.  Breath sounds are equal.  Heart:  Regular rate and rhythm without murmurs, rubs, or gallops.  Abdomen:  Nontender without masses or hepatosplenomegaly.  Bowel sounds are present.  Mild right flank tenderness is present.  Extremities:  No edema.  No calf tenderness Skin:  No rash present.   ED Course  Procedures none   Labs Reviewed  POCT RAPID STREP A (OFFICE) negative  POCT URINALYSIS DIP (MANUAL ENTRY) GLU 500mg /dL; KET trace; LEU smal  URINE CULTURE pending  STREP DNA PROBE pending       1. Acute pharyngitis; suspect early viral URI   2. Dysuria; ? cystitis       MDM  Urine culture pending.  Throat culture pending. Begin Keflex.  Prescription written for Benzonatate [facility]) to take at bedtime for night-time cough.  Take plain Mucinex (guaifenesin) twice daily for cough and congestion.  Increase fluid intake, rest. May use Afrin nasal spray (or generic oxymetazoline) twice daily for about 5 days.  Also recommend using saline nasal spray several times daily and saline nasal irrigation (AYR is a common brand) Stop all antihistamines for now, and other non-prescription cough/cold preparations. May use cranberry pills rather than cranberry juice.  Follow-up with family doctor if not improving 7 to 10 days.  Followup with family doctor for elevated blood pressure.        Lattie Haw, MD 02/01/12 1001  Addendum: Urine culture reveals staph aureus resistant to Keflex.  Patient reports that her urinary symptoms have not improved.  Advised to stop Keflex.  RX for doxycycline e-prescribed.  Lattie Haw, MD 02/04/12 1003

## 2012-01-31 NOTE — ED Notes (Signed)
Pt c/o urinary frequency and dysuria x today. She also c/o sore throat and swollen lymph nodes in neck x 1wk. Denies fever.

## 2012-02-01 LAB — STREP A DNA PROBE: GASP: NEGATIVE

## 2012-02-02 ENCOUNTER — Telehealth: Payer: Self-pay | Admitting: Emergency Medicine

## 2012-02-04 ENCOUNTER — Telehealth: Payer: Self-pay | Admitting: Emergency Medicine

## 2012-02-04 LAB — URINE CULTURE

## 2012-02-04 MED ORDER — DOXYCYCLINE HYCLATE 100 MG PO CAPS: 100.0000 mg | ORAL_CAPSULE | Freq: Two times a day (BID) | ORAL | Status: DC

## 2013-12-23 ENCOUNTER — Encounter: Payer: Self-pay | Admitting: *Deleted

## 2013-12-23 ENCOUNTER — Emergency Department
Admission: EM | Admit: 2013-12-23 | Discharge: 2013-12-23 | Disposition: A | Discharge status: Home / Self Care | Payer: BC Managed Care – PPO | Source: Home / Self Care | Attending: Family Medicine | Admitting: Family Medicine

## 2013-12-23 DIAGNOSIS — N3000 Acute cystitis without hematuria: Secondary | ICD-10-CM

## 2013-12-23 LAB — POCT URINALYSIS DIP (MANUAL ENTRY)
BILIRUBIN UA: NEGATIVE
Bilirubin, UA: NEGATIVE
Blood, UA: NEGATIVE
GLUCOSE UA: NEGATIVE
Nitrite, UA: NEGATIVE
Protein Ur, POC: NEGATIVE
SPEC GRAV UA: 1.015 (ref 1.005–1.03)
UROBILINOGEN UA: 0.2 (ref 0–1)
pH, UA: 7.5 (ref 5–8)

## 2013-12-23 MED ORDER — CEFTRIAXONE SODIUM 1 G IJ SOLR
1.0000 g | Freq: Once | INTRAMUSCULAR | Status: AC
  Administered 2013-12-23: 1 g via INTRAMUSCULAR

## 2013-12-23 MED ORDER — CEPHALEXIN 500 MG PO CAPS
500.0000 mg | ORAL_CAPSULE | Freq: Three times a day (TID) | ORAL | Status: DC
Start: 2013-12-23 — End: 2014-01-30

## 2013-12-23 NOTE — ED Provider Notes (Signed)
Katherine Luna is a 58 y.o. female who presents to Urgent Care today for UTI. Patient has a 1-1/2 week history of urinary frequency urgency and burning with urination. Symptoms worsened as day or 2 ago. She developed back pain as well. [facility] he suspects pyelonephritis and sent her to urgent care for evaluation and management. Yesterday she started taking Keflex and Aleve which seemed to help. No fevers or chills vomiting or diarrhea.   Past Medical History  Diagnosis Date  . Diabetes mellitus without complication   . Hypertension   . Hyperlipemia    Past Surgical History  Procedure Laterality Date  . Abdominal hysterectomy    . Knee surgery    . Cesarean section    . Abdominoplasty     History  Substance Use Topics  . Smoking status: Never Smoker   . Smokeless tobacco: Never Used  . Alcohol Use: No   ROS as above Medications: Current Facility-Administered Medications  Medication Dose Route Frequency Provider Last Rate Last Dose  . cefTRIAXone (ROCEPHIN) injection 1 g  1 g Intramuscular Once Rodolph BongEvan S Dietra Stokely, MD       Current Outpatient Prescriptions  Medication Sig Dispense Refill  . aspirin 81 MG tablet Take 81 mg by mouth daily.    . benzonatate (TESSALON) 200 MG capsule Take 1 capsule (200 mg total) by mouth at bedtime. Take as needed for cough 12 capsule 0  . co-enzyme Q-10 30 MG capsule Take 30 mg by mouth 3 (three) times daily.    Marland Kitchen. GLIMEPIRIDE PO Take by mouth.    Marland Kitchen. lisinopril (PRINIVIL,ZESTRIL) 5 MG tablet Take 5 mg by mouth daily.    . metFORMIN (GLUCOPHAGE) 500 MG tablet Take 500 mg by mouth 2 (two) times daily with a meal.    . simvastatin (ZOCOR) 40 MG tablet Take 40 mg by mouth every evening.     Allergies  Allergen Reactions  . Codeine     REACTION: GI Upset  . Eggs Or Egg-Derived Products   . Morphine And Related   . Penicillins     REACTION: Rash (x1 time only), has taken Amox with no problem     Exam:  BP 134/86  mmHg  Pulse 78  Temp(Src) 97.6 F (36.4 C) (Oral)  Resp 14  Wt 162 lb (73.483 kg)  SpO2 100% Gen: Well NAD HEENT: EOMI,  MMM Lungs: Normal work of breathing. CTABL Heart: RRR no MRG Abd: NABS, Soft. Nondistended, Nontender no CVA tenderness to percussion. No rebound or guarding. No masses palpated. Exts: Brisk capillary refill, warm and well perfused.   Patient was given a 1 g ceftriaxone injection IM. Point-of-care urinalysis shows trace leukocyte Estrace.  No results found for this or any previous visit (from the past 24 hour(s)). No results found.  Assessment and Plan: 58 y.o. female with partially treated UTI with possible pyelonephritis. Culture pending. Patient was given IM ceftriaxone as above. Will continue treatment with Keflex starting tomorrow. Follow-up with PCP.  Discussed warning signs or symptoms. Please see discharge instructions. Patient expresses understanding.     Rodolph BongEvan S Maurene Hollin, MD 12/23/13 1026

## 2013-12-23 NOTE — ED Notes (Signed)
Katherine Luna c/o dysuria, flank pain intermittent x 1 1/2 weeks. C/o chills and heamturia today.

## 2013-12-23 NOTE — Discharge Instructions (Signed)
°  Thank you for coming in today. Start taking Keflex 3 times daily tomorrow. Follow-up with primary care provider as needed. If your belly pain worsens, or you have high fever, bad vomiting, blood in your stool or black tarry stool go to the Emergency Room.    Urinary Tract Infection Urinary tract infections (UTIs) can develop anywhere along your urinary tract. Your urinary tract is your body's drainage system for removing wastes and extra water. Your urinary tract includes two kidneys, two ureters, a bladder, and a urethra. Your kidneys are a pair of bean-shaped organs. Each kidney is about the size of your fist. They are located below your ribs, one on each side of your spine. CAUSES Infections are caused by microbes, which are microscopic organisms, including fungi, viruses, and bacteria. These organisms are so small that they can only be seen through a microscope. Bacteria are the microbes that most commonly cause UTIs. SYMPTOMS  Symptoms of UTIs may vary by age and gender of the patient and by the location of the infection. Symptoms in young women typically include a frequent and intense urge to urinate and a painful, burning feeling in the bladder or urethra during urination. Older women and men are more likely to be tired, shaky, and weak and have muscle aches and abdominal pain. A fever may mean the infection is in your kidneys. Other symptoms of a kidney infection include pain in your back or sides below the ribs, nausea, and vomiting. DIAGNOSIS To diagnose a UTI, your caregiver will ask you about your symptoms. Your caregiver also will ask to provide a urine sample. The urine sample will be tested for bacteria and white blood cells. White blood cells are made by your body to help fight infection. TREATMENT  Typically, UTIs can be treated with medication. Because most UTIs are caused by a bacterial infection, they usually can be treated with the use of antibiotics. The choice of antibiotic  and length of treatment depend on your symptoms and the type of bacteria causing your infection. HOME CARE INSTRUCTIONS  If you were prescribed antibiotics, take them exactly as your caregiver instructs you. Finish the medication even if you feel better after you have only taken some of the medication.  Drink enough water and fluids to keep your urine clear or pale yellow.  Avoid caffeine, tea, and carbonated beverages. They tend to irritate your bladder.  Empty your bladder often. Avoid holding urine for long periods of time.  Empty your bladder before and after sexual intercourse.  After a bowel movement, women should cleanse from front to back. Use each tissue only once. SEEK MEDICAL CARE IF:   You have back pain.  You develop a fever.  Your symptoms do not begin to resolve within 3 days. SEEK IMMEDIATE MEDICAL CARE IF:   You have severe back pain or lower abdominal pain.  You develop chills.  You have nausea or vomiting.  You have continued burning or discomfort with urination. MAKE SURE YOU:   Understand these instructions.  Will watch your condition.  Will get help right away if you are not doing well or get worse. Document Released: 10/13/2004 Document Revised: 07/05/2011 Document Reviewed: 02/11/2011 [facility] Patient Information 2015 [employer], [place]. This information is not intended to replace advice given to you by your health care provider. Make sure you discuss any questions you have with your health care provider.

## 2013-12-24 LAB — URINE CULTURE
Colony Count: NO GROWTH
Organism ID, Bacteria: NO GROWTH

## 2013-12-25 ENCOUNTER — Telehealth: Payer: Self-pay | Admitting: Emergency Medicine

## 2014-01-30 ENCOUNTER — Emergency Department
Admission: EM | Admit: 2014-01-30 | Discharge: 2014-01-30 | Disposition: A | Discharge status: Home / Self Care | Payer: BC Managed Care – PPO | Source: Home / Self Care | Attending: Emergency Medicine | Admitting: Emergency Medicine

## 2014-01-30 ENCOUNTER — Encounter: Payer: Self-pay | Admitting: Emergency Medicine

## 2014-01-30 DIAGNOSIS — R112 Nausea with vomiting, unspecified: Secondary | ICD-10-CM

## 2014-01-30 DIAGNOSIS — J208 Acute bronchitis due to other specified organisms: Secondary | ICD-10-CM

## 2014-01-30 DIAGNOSIS — R197 Diarrhea, unspecified: Secondary | ICD-10-CM

## 2014-01-30 MED ORDER — AZITHROMYCIN 250 MG PO TABS: ORAL_TABLET | ORAL | Status: DC

## 2014-01-30 MED ORDER — ONDANSETRON 4 MG PO TBDP: 4.0000 mg | ORAL_TABLET | Freq: Three times a day (TID) | ORAL | Status: DC | PRN

## 2014-01-30 NOTE — Discharge Instructions (Signed)
Acute Bronchitis Bronchitis is inflammation of the airways that extend from the windpipe into the lungs (bronchi). The inflammation often causes mucus to develop. This leads to a cough, which is the most common symptom of bronchitis.  In acute bronchitis, the condition usually develops suddenly and goes away over time, usually in a couple weeks. Smoking, allergies, and asthma can make bronchitis worse. Repeated episodes of bronchitis may cause further lung problems.  CAUSES Acute bronchitis is most often caused by the same virus that causes a cold. The virus can spread from person to person (contagious) through coughing, sneezing, and touching contaminated objects. SIGNS AND SYMPTOMS   Cough.   Fever.   Coughing up mucus.   Body aches.   Chest congestion.   Chills.   Shortness of breath.   Sore throat.  DIAGNOSIS  Acute bronchitis is usually diagnosed through a physical exam. Your health care provider will also ask you questions about your medical history. Tests, such as chest X-rays, are sometimes done to rule out other conditions.  TREATMENT  Acute bronchitis usually goes away in a couple weeks. Oftentimes, no medical treatment is necessary. Medicines are sometimes given for relief of fever or cough. Antibiotic medicines are usually not needed but may be prescribed in certain situations. In some cases, an inhaler may be recommended to help reduce shortness of breath and control the cough. A cool mist vaporizer may also be used to help thin bronchial secretions and make it easier to clear the chest.  HOME CARE INSTRUCTIONS  Get plenty of rest.   Drink enough fluids to keep your urine clear or pale yellow (unless you have a medical condition that requires fluid restriction). Increasing fluids may help thin your respiratory secretions (sputum) and reduce chest congestion, and it will prevent dehydration.   Take medicines only as directed by your health care provider.  If  you were prescribed an antibiotic medicine, finish it all even if you start to feel better.  Avoid smoking and secondhand smoke. Exposure to cigarette smoke or irritating chemicals will make bronchitis worse. If you are a smoker, consider using nicotine gum or skin patches to help control withdrawal symptoms. Quitting smoking will help your lungs heal faster.   Reduce the chances of another bout of acute bronchitis by washing your hands frequently, avoiding people with cold symptoms, and trying not to touch your hands to your mouth, nose, or eyes.   Keep all follow-up visits as directed by your health care provider.  SEEK MEDICAL CARE IF: Your symptoms do not improve after 1 week of treatment.  SEEK IMMEDIATE MEDICAL CARE IF:  You develop an increased fever or chills.   You have chest pain.   You have severe shortness of breath.  You have bloody sputum.   You develop dehydration.  You faint or repeatedly feel like you are going to pass out.  You develop repeated vomiting.  You develop a severe headache. MAKE SURE YOU:   Understand these instructions.  Will watch your condition.  Will get help right away if you are not doing well or get worse. Document Released: 02/11/2004 Document Revised: 05/20/2013 Document Reviewed: 06/26/2012 [facility] Patient Information 2015 [place], [place]. This information is not intended to replace advice given to you by your health care provider. Make sure you discuss any questions you have with your health care provider. Gastritis, Adult Gastritis is soreness and swelling (inflammation) of the lining of the stomach. Gastritis can develop as a sudden onset (acute) or long-term (  chronic) condition. If gastritis is not treated, it can lead to stomach bleeding and ulcers. CAUSES  Gastritis occurs when the stomach lining is weak or damaged. Digestive juices from the stomach then inflame the weakened stomach lining. The stomach lining may be weak or  damaged due to viral or bacterial infections. One common bacterial infection is the Helicobacter pylori infection. Gastritis can also result from excessive alcohol consumption, taking certain medicines, or having too much acid in the stomach.  SYMPTOMS  In some cases, there are no symptoms. When symptoms are present, they may include:  Pain or a burning sensation in the upper abdomen.  Nausea.  Vomiting.  An uncomfortable feeling of fullness after eating. DIAGNOSIS  Your caregiver may suspect you have gastritis based on your symptoms and a physical exam. To determine the cause of your gastritis, your caregiver may perform the following:  Blood or stool tests to check for the H pylori bacterium.  Gastroscopy. A thin, flexible tube (endoscope) is passed down the esophagus and into the stomach. The endoscope has a light and camera on the end. Your caregiver uses the endoscope to view the inside of the stomach.  Taking a tissue sample (biopsy) from the stomach to examine under a microscope. TREATMENT  Depending on the cause of your gastritis, medicines may be prescribed. If you have a bacterial infection, such as an H pylori infection, antibiotics may be given. If your gastritis is caused by too much acid in the stomach, H2 blockers or antacids may be given. Your caregiver may recommend that you stop taking aspirin, ibuprofen, or other nonsteroidal anti-inflammatory drugs (NSAIDs). HOME CARE INSTRUCTIONS  Only take over-the-counter or prescription medicines as directed by your caregiver.  If you were given antibiotic medicines, take them as directed. Finish them even if you start to feel better.  Drink enough fluids to keep your urine clear or pale yellow.  Avoid foods and drinks that make your symptoms worse, such as:  Caffeine or alcoholic drinks.  Chocolate.  Peppermint or mint flavorings.  Garlic and onions.  Spicy foods.  Citrus fruits, such as oranges, lemons, or  limes.  Tomato-based foods such as sauce, chili, salsa, and pizza.  Fried and fatty foods.  Eat small, frequent meals instead of large meals. SEEK IMMEDIATE MEDICAL CARE IF:   You have black or dark red stools.  You vomit blood or material that looks like coffee grounds.  You are unable to keep fluids down.  Your abdominal pain gets worse.  You have a fever.  You do not feel better after 1 week.  You have any other questions or concerns. MAKE SURE YOU:  Understand these instructions.  Will watch your condition.  Will get help right away if you are not doing well or get worse. Document Released: 12/28/2000 Document Revised: 07/05/2011 Document Reviewed: 02/16/2011 [facility] Patient Information 2015 [place]. This information is not intended to replace advice given to you by your health care provider. Make sure you discuss any questions you have with your health care provider.

## 2014-01-30 NOTE — ED Notes (Signed)
Pt c/o body aches, fever and cough x5 days. Had vomiting last night and states temp was 101.6.

## 2014-01-30 NOTE — ED Provider Notes (Signed)
CSN: [id number]     Arrival date & time 01/30/14  1191 History   None    No chief complaint on file.  (Consider location/radiation/quality/duration/timing/severity/associated sxs/prior Treatment) Patient is a 59 y.o. female presenting with vomiting. The history is provided by the patient. No language interpreter was used.  Emesis Severity:  Moderate Duration:  1 day Timing:  Constant Number of daily episodes:  4 Progression:  Worsening Chronicity:  New Recent urination:  Normal Relieved by:  Nothing Risk factors: no suspect food intake     Past Medical History  Diagnosis Date  . Diabetes mellitus without complication   . Hypertension   . Hyperlipemia    Past Surgical History  Procedure Laterality Date  . Abdominal hysterectomy    . Knee surgery    . Cesarean section    . Abdominoplasty     Family History  Problem Relation Age of Onset  . Hypertension Mother   . Stroke Mother   . Cancer Mother   . Heart attack Father   . Diabetes Father    History  Substance Use Topics  . Smoking status: Never Smoker   . Smokeless tobacco: Never Used  . Alcohol Use: No   OB History    No data available     Review of Systems  Gastrointestinal: Positive for vomiting.  All other systems reviewed and are negative.   Allergies  Codeine; Eggs or egg-derived products; Morphine and related; and Penicillins  Home Medications   Prior to Admission medications   Medication Sig Start Date End Date Taking? Authorizing Provider  aspirin 81 MG tablet Take 81 mg by mouth daily.    Historical Provider, MD  benzonatate (TESSALON) 200 MG capsule Take 1 capsule (200 mg total) by mouth at bedtime. Take as needed for cough 01/31/12   Lattie Haw, MD  cephALEXin (KEFLEX) 500 MG capsule Take 1 capsule (500 mg total) by mouth 3 (three) times daily. 12/23/13   Rodolph Bong, MD  co-enzyme Q-10 30 MG capsule Take 30 mg by mouth 3 (three) times daily.    Historical Provider, MD  GLIMEPIRIDE PO  Take by mouth.    Historical Provider, MD  lisinopril (PRINIVIL,ZESTRIL) 5 MG tablet Take 5 mg by mouth daily.    Historical Provider, MD  metFORMIN (GLUCOPHAGE) 500 MG tablet Take 500 mg by mouth 2 (two) times daily with a meal.    Historical Provider, MD  simvastatin (ZOCOR) 40 MG tablet Take 40 mg by mouth every evening.    Historical Provider, MD   There were no vitals taken for this visit. Physical Exam  Constitutional: She is oriented to person, place, and time. She appears well-developed and well-nourished.  HENT:  Head: Normocephalic.  Right Ear: External ear normal.  Eyes: Conjunctivae and EOM are normal. Pupils are equal, round, and reactive to light.  Neck: Normal range of motion.  Cardiovascular: Normal rate and normal heart sounds.   Pulmonary/Chest: Effort normal and breath sounds normal.  Abdominal: Soft. She exhibits no distension.  Musculoskeletal: Normal range of motion.  Neurological: She is alert and oriented to person, place, and time.  Skin: Skin is warm.  Psychiatric: She has a normal mood and affect.  Nursing note and vitals reviewed.   ED Course  Procedures (including critical care time) Labs Review Labs Reviewed - No data to display  Imaging Review No results found.   MDM   1. Acute bronchitis due to other specified organisms   2. Diarrhea  3. Nausea and vomiting, vomiting of unspecified type    Zofran odt Zithromax Return if any problems AVS  Elson AreasLeslie K Sofia, PA-C 01/30/14 1057

## 2014-02-01 ENCOUNTER — Telehealth: Payer: Self-pay | Admitting: Emergency Medicine

## 2014-02-01 NOTE — ED Notes (Signed)
Inquired about patient's status; encourage them to call with questions/concerns.  

## 2016-08-16 ENCOUNTER — Emergency Department (HOSPITAL_COMMUNITY): Payer: BC Managed Care – PPO

## 2016-08-16 ENCOUNTER — Other Ambulatory Visit (HOSPITAL_COMMUNITY): Payer: Self-pay

## 2016-08-16 ENCOUNTER — Observation Stay (HOSPITAL_COMMUNITY)
Admission: EM | Admit: 2016-08-16 | Discharge: 2016-08-18 | Disposition: A | Discharge status: Home / Self Care | Payer: BC Managed Care – PPO | Attending: Internal Medicine | Admitting: Internal Medicine

## 2016-08-16 ENCOUNTER — Encounter (HOSPITAL_COMMUNITY): Payer: Self-pay | Admitting: Emergency Medicine

## 2016-08-16 DIAGNOSIS — Z885 Allergy status to narcotic agent status: Secondary | ICD-10-CM | POA: Diagnosis not present

## 2016-08-16 DIAGNOSIS — R55 Syncope and collapse: Secondary | ICD-10-CM | POA: Diagnosis present

## 2016-08-16 DIAGNOSIS — S32030A Wedge compression fracture of third lumbar vertebra, initial encounter for closed fracture: Secondary | ICD-10-CM

## 2016-08-16 DIAGNOSIS — S069X9A Unspecified intracranial injury with loss of consciousness of unspecified duration, initial encounter: Secondary | ICD-10-CM

## 2016-08-16 DIAGNOSIS — Z880 Allergy status to penicillin: Secondary | ICD-10-CM | POA: Insufficient documentation

## 2016-08-16 DIAGNOSIS — Z79899 Other long term (current) drug therapy: Secondary | ICD-10-CM

## 2016-08-16 DIAGNOSIS — E785 Hyperlipidemia, unspecified: Secondary | ICD-10-CM | POA: Insufficient documentation

## 2016-08-16 DIAGNOSIS — S2241XA Multiple fractures of ribs, right side, initial encounter for closed fracture: Secondary | ICD-10-CM | POA: Diagnosis not present

## 2016-08-16 DIAGNOSIS — Z91012 Allergy to eggs: Secondary | ICD-10-CM | POA: Diagnosis not present

## 2016-08-16 DIAGNOSIS — Y9352 Activity, horseback riding: Secondary | ICD-10-CM | POA: Diagnosis not present

## 2016-08-16 DIAGNOSIS — Y998 Other external cause status: Secondary | ICD-10-CM | POA: Insufficient documentation

## 2016-08-16 DIAGNOSIS — G4730 Sleep apnea, unspecified: Secondary | ICD-10-CM | POA: Insufficient documentation

## 2016-08-16 DIAGNOSIS — S060X1A Concussion with loss of consciousness of 30 minutes or less, initial encounter: Secondary | ICD-10-CM | POA: Diagnosis not present

## 2016-08-16 DIAGNOSIS — J90 Pleural effusion, not elsewhere classified: Secondary | ICD-10-CM | POA: Diagnosis not present

## 2016-08-16 DIAGNOSIS — Z7982 Long term (current) use of aspirin: Secondary | ICD-10-CM | POA: Insufficient documentation

## 2016-08-16 DIAGNOSIS — Z8249 Family history of ischemic heart disease and other diseases of the circulatory system: Secondary | ICD-10-CM | POA: Insufficient documentation

## 2016-08-16 DIAGNOSIS — E119 Type 2 diabetes mellitus without complications: Secondary | ICD-10-CM | POA: Insufficient documentation

## 2016-08-16 DIAGNOSIS — I10 Essential (primary) hypertension: Secondary | ICD-10-CM | POA: Insufficient documentation

## 2016-08-16 DIAGNOSIS — G44309 Post-traumatic headache, unspecified, not intractable: Secondary | ICD-10-CM

## 2016-08-16 DIAGNOSIS — W5512XA Struck by horse, initial encounter: Secondary | ICD-10-CM

## 2016-08-16 DIAGNOSIS — S32501A Unspecified fracture of right pubis, initial encounter for closed fracture: Secondary | ICD-10-CM | POA: Diagnosis not present

## 2016-08-16 DIAGNOSIS — F0781 Postconcussional syndrome: Secondary | ICD-10-CM

## 2016-08-16 DIAGNOSIS — S32039A Unspecified fracture of third lumbar vertebra, initial encounter for closed fracture: Secondary | ICD-10-CM | POA: Insufficient documentation

## 2016-08-16 DIAGNOSIS — I878 Other specified disorders of veins: Secondary | ICD-10-CM | POA: Insufficient documentation

## 2016-08-16 DIAGNOSIS — Z7984 Long term (current) use of oral hypoglycemic drugs: Secondary | ICD-10-CM | POA: Insufficient documentation

## 2016-08-16 DIAGNOSIS — S32591A Other specified fracture of right pubis, initial encounter for closed fracture: Secondary | ICD-10-CM | POA: Insufficient documentation

## 2016-08-16 DIAGNOSIS — R0789 Other chest pain: Secondary | ICD-10-CM

## 2016-08-16 LAB — CBC WITH DIFFERENTIAL/PLATELET
BASOS PCT: 0 %
Basophils Absolute: 0 10*3/uL (ref 0.0–0.1)
EOS ABS: 0.1 10*3/uL (ref 0.0–0.7)
EOS PCT: 1 %
HCT: 37.7 % (ref 36.0–46.0)
Hemoglobin: 12.2 g/dL (ref 12.0–15.0)
LYMPHS ABS: 2.3 10*3/uL (ref 0.7–4.0)
Lymphocytes Relative: 18 %
MCH: 26.1 pg (ref 26.0–34.0)
MCHC: 32.4 g/dL (ref 30.0–36.0)
MCV: 80.6 fL (ref 78.0–100.0)
Monocytes Absolute: 0.8 10*3/uL (ref 0.1–1.0)
Monocytes Relative: 6 %
NEUTROS PCT: 75 %
Neutro Abs: 9.7 10*3/uL — ABNORMAL HIGH (ref 1.7–7.7)
PLATELETS: 298 10*3/uL (ref 150–400)
RBC: 4.68 MIL/uL (ref 3.87–5.11)
RDW: 13.6 % (ref 11.5–15.5)
WBC: 13 10*3/uL — AB (ref 4.0–10.5)

## 2016-08-16 LAB — BASIC METABOLIC PANEL
ANION GAP: 10 (ref 5–15)
BUN: 6 mg/dL (ref 6–20)
CALCIUM: 9 mg/dL (ref 8.9–10.3)
CO2: 25 mmol/L (ref 22–32)
CREATININE: 0.95 mg/dL (ref 0.44–1.00)
Chloride: 102 mmol/L (ref 101–111)
GLUCOSE: 105 mg/dL — AB (ref 65–99)
Potassium: 3.3 mmol/L — ABNORMAL LOW (ref 3.5–5.1)
Sodium: 137 mmol/L (ref 135–145)

## 2016-08-16 LAB — GLUCOSE, CAPILLARY: Glucose-Capillary: 159 mg/dL — ABNORMAL HIGH (ref 65–99)

## 2016-08-16 LAB — I-STAT TROPONIN, ED: TROPONIN I, POC: 0 ng/mL (ref 0.00–0.08)

## 2016-08-16 MED ORDER — POTASSIUM CHLORIDE CRYS ER 20 MEQ PO TBCR
40.0000 meq | EXTENDED_RELEASE_TABLET | Freq: Once | ORAL | Status: AC
  Administered 2016-08-16: 40 meq via ORAL
  Filled 2016-08-16: qty 2

## 2016-08-16 MED ORDER — CITALOPRAM HYDROBROMIDE 20 MG PO TABS
20.0000 mg | ORAL_TABLET | Freq: Every day | ORAL | Status: DC
  Administered 2016-08-16 – 2016-08-17 (×2): 20 mg via ORAL
  Filled 2016-08-16 (×2): qty 1

## 2016-08-16 MED ORDER — SODIUM CHLORIDE 0.9% FLUSH
3.0000 mL | Freq: Two times a day (BID) | INTRAVENOUS | Status: DC
  Administered 2016-08-17 – 2016-08-18 (×2): 3 mL via INTRAVENOUS

## 2016-08-16 MED ORDER — ONDANSETRON HCL 4 MG/2ML IJ SOLN
4.0000 mg | Freq: Four times a day (QID) | INTRAMUSCULAR | Status: DC | PRN
  Filled 2016-08-16: qty 2

## 2016-08-16 MED ORDER — ONDANSETRON HCL 4 MG/2ML IJ SOLN
4.0000 mg | Freq: Once | INTRAMUSCULAR | Status: DC
  Filled 2016-08-16: qty 2

## 2016-08-16 MED ORDER — SODIUM CHLORIDE 0.9 % IV SOLN: 250.0000 mL | INTRAVENOUS | Status: DC | PRN

## 2016-08-16 MED ORDER — ACETAMINOPHEN 325 MG PO TABS
650.0000 mg | ORAL_TABLET | Freq: Four times a day (QID) | ORAL | Status: DC | PRN
  Administered 2016-08-16 (×2): 650 mg via ORAL
  Filled 2016-08-16: qty 2

## 2016-08-16 MED ORDER — LISINOPRIL 20 MG PO TABS
30.0000 mg | ORAL_TABLET | Freq: Every day | ORAL | Status: DC
Start: 2016-08-17 — End: 2016-08-18
  Administered 2016-08-17 – 2016-08-18 (×2): 30 mg via ORAL
  Filled 2016-08-16 (×2): qty 1

## 2016-08-16 MED ORDER — ASPIRIN 81 MG PO TABS: 81.0000 mg | ORAL_TABLET | Freq: Every day | ORAL | Status: DC

## 2016-08-16 MED ORDER — ACETAMINOPHEN 500 MG PO TABS
1000.0000 mg | ORAL_TABLET | Freq: Once | ORAL | Status: AC
  Administered 2016-08-16: 1000 mg via ORAL
  Filled 2016-08-16: qty 2

## 2016-08-16 MED ORDER — HYDROCHLOROTHIAZIDE 12.5 MG PO CAPS
12.5000 mg | ORAL_CAPSULE | Freq: Every day | ORAL | Status: DC
  Administered 2016-08-17 – 2016-08-18 (×2): 12.5 mg via ORAL
  Filled 2016-08-16 (×2): qty 1

## 2016-08-16 MED ORDER — ENOXAPARIN SODIUM 40 MG/0.4ML ~~LOC~~ SOLN
40.0000 mg | SUBCUTANEOUS | Status: DC
  Administered 2016-08-16 – 2016-08-17 (×2): 40 mg via SUBCUTANEOUS
  Filled 2016-08-16 (×2): qty 0.4

## 2016-08-16 MED ORDER — SODIUM CHLORIDE 0.9% FLUSH
3.0000 mL | INTRAVENOUS | Status: DC | PRN
  Administered 2016-08-17 – 2016-08-18 (×2): 3 mL via INTRAVENOUS
  Filled 2016-08-16 (×2): qty 3

## 2016-08-16 MED ORDER — ACETAMINOPHEN 650 MG RE SUPP: 650.0000 mg | Freq: Four times a day (QID) | RECTAL | Status: DC | PRN

## 2016-08-16 MED ORDER — ASPIRIN EC 81 MG PO TBEC
81.0000 mg | DELAYED_RELEASE_TABLET | Freq: Every day | ORAL | Status: DC
  Administered 2016-08-17 – 2016-08-18 (×2): 81 mg via ORAL
  Filled 2016-08-16 (×2): qty 1

## 2016-08-16 MED ORDER — INSULIN ASPART 100 UNIT/ML ~~LOC~~ SOLN
0.0000 [IU] | Freq: Three times a day (TID) | SUBCUTANEOUS | Status: DC
  Administered 2016-08-17: 2 [IU] via SUBCUTANEOUS
  Administered 2016-08-18: 1 [IU] via SUBCUTANEOUS
  Administered 2016-08-18: 2 [IU] via SUBCUTANEOUS

## 2016-08-16 MED ORDER — KETOROLAC TROMETHAMINE 30 MG/ML IJ SOLN
30.0000 mg | Freq: Four times a day (QID) | INTRAMUSCULAR | Status: DC | PRN
  Administered 2016-08-16 – 2016-08-17 (×3): 30 mg via INTRAVENOUS
  Filled 2016-08-16 (×3): qty 1

## 2016-08-16 MED ORDER — ONDANSETRON HCL 4 MG PO TABS: 4.0000 mg | ORAL_TABLET | Freq: Four times a day (QID) | ORAL | Status: DC | PRN

## 2016-08-16 MED ORDER — SIMVASTATIN 40 MG PO TABS
40.0000 mg | ORAL_TABLET | Freq: Every evening | ORAL | Status: DC
  Administered 2016-08-16 – 2016-08-17 (×2): 40 mg via ORAL
  Filled 2016-08-16 (×2): qty 1

## 2016-08-16 MED ORDER — SODIUM CHLORIDE 0.9% FLUSH
3.0000 mL | Freq: Two times a day (BID) | INTRAVENOUS | Status: DC
  Administered 2016-08-16 – 2016-08-18 (×3): 3 mL via INTRAVENOUS

## 2016-08-16 MED ORDER — SODIUM CHLORIDE 0.9 % IV BOLUS (SEPSIS)
1000.0000 mL | Freq: Once | INTRAVENOUS | Status: AC
  Administered 2016-08-16: 1000 mL via INTRAVENOUS

## 2016-08-16 MED ORDER — ONDANSETRON 4 MG PO TBDP
4.0000 mg | ORAL_TABLET | Freq: Three times a day (TID) | ORAL | Status: DC | PRN
Start: 2016-08-16 — End: 2016-08-16

## 2016-08-16 NOTE — ED Provider Notes (Signed)
Medical screening examination/treatment/procedure(s) were conducted as a shared visit with non-physician practitioner(s) and myself.  I personally evaluated the patient during the encounter.   EKG Interpretation None       Results for orders placed or performed during the hospital encounter of 12/23/13  Urine culture  Result Value Ref Range   Colony Count NO GROWTH    Organism ID, Bacteria NO GROWTH   POCT urinalysis dipstick (new)  Result Value Ref Range   Color, UA yellow    Clarity, UA clear    Glucose, UA neg    Bilirubin, UA negative    Ketones, POC UA negative    Spec Grav, UA 1.015 1.005 - 1.03   Blood, UA negative    pH, UA 7.5 5 - 8   Protein Ur, POC negative    Urobilinogen, UA 0.2 0 - 1   Nitrite, UA Negative    Leukocytes, UA Trace    Dg Chest 2 View  Result Date: 08/16/2016 CLINICAL DATA:  Horse riding injury. EXAM: CHEST  2 VIEW COMPARISON:  No prior. FINDINGS: Mediastinum hilar structures normal. Lungs are clear. No pleural effusion pneumothorax. Heart size normal. No acute bony abnormality. Degenerative changes thoracic spine. IMPRESSION: No acute abnormality. Electronically Signed   By: Maisie Fushomas  Register   On: 08/16/2016 13:01   Dg Lumbar Spine Complete  Result Date: 08/16/2016 CLINICAL DATA:  horse's head collided with her own and knocked her out today, causing her to fall from the horse. Pt c/o lower right side chest pain, posterior right hip pain and low back pain. Hx of HTN, DM. No hx of lumbar or pelvic injuries or surgeries. EXAM: LUMBAR SPINE - COMPLETE 4+ VIEW COMPARISON:  None. FINDINGS: There is subtle contour angulation deformity of the anterior cortex of the L3 vertebral body, suggesting mild compression fracture. No significant loss of height. No retropulsion is evident. Posterior elements appear intact. Alignment is preserved. No other acute findings. Interspaces preserved throughout. Early anterior endplate spurring [id number]. No other significant osseous  degenerative change. Bilateral pelvic phleboliths. IMPRESSION: 1. Possible mild L3 compression fracture without significant loss of height or retropulsion. Electronically Signed   By: Corlis Leak  Hassell M.D.   On: 08/16/2016 13:06   Ct Head Wo Contrast  Result Date: 08/16/2016 CLINICAL DATA:  Larey SeatFell from horse today.  Head and neck pain. EXAM: CT HEAD WITHOUT CONTRAST CT CERVICAL SPINE WITHOUT CONTRAST TECHNIQUE: Multidetector CT imaging of the head and cervical spine was performed following the standard protocol without intravenous contrast. Multiplanar CT image reconstructions of the cervical spine were also generated. COMPARISON:  Radiography 03/22/2009 FINDINGS: CT HEAD FINDINGS Brain: No evidence of malformation, atrophy, old or acute small or large vessel infarction, mass lesion, hemorrhage, hydrocephalus or extra-axial collection. No evidence of pituitary lesion. Vascular: No vascular calcification.  No hyperdense vessels. Skull: Normal.  No fracture or focal bone lesion. Sinuses/Orbits: Visualized sinuses are clear. No fluid in the middle ears or mastoids. Visualized orbits are normal. Other: None significant CT CERVICAL SPINE FINDINGS Alignment: Normal Skull base and vertebrae: Normal.  No fracture. Soft tissues and spinal canal: No soft tissue injury seen. Disc levels: Degenerative arthropathy at the C1-2 articulation on the left. Degenerative spondylosis with endplate osteophytes and shallow disc protrusions at C3-4, C4-5, C5-6 and C6-7. 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 Foraminal stenosis because of these degenerative changes. Most pronounced on the left at C4-5, C5-6 and C6-7. Upper chest: Negative Other: None IMPRESSION: Head CT:  Normal.  No traumatic  finding. Cervical spine CT: No acute or traumatic finding. Degenerative changes as outlined above. Electronically Signed   By: Paulina FusiMark  Shogry M.D.   On: 08/16/2016 13:31   Ct Cervical Spine Wo Contrast  Result Date: 08/16/2016 CLINICAL DATA:  Larey SeatFell  from horse today.  Head and neck pain. EXAM: CT HEAD WITHOUT CONTRAST CT CERVICAL SPINE WITHOUT CONTRAST TECHNIQUE: Multidetector CT imaging of the head and cervical spine was performed following the standard protocol without intravenous contrast. Multiplanar CT image reconstructions of the cervical spine were also generated. COMPARISON:  Radiography 03/22/2009 FINDINGS: CT HEAD FINDINGS Brain: No evidence of malformation, atrophy, old or acute small or large vessel infarction, mass lesion, hemorrhage, hydrocephalus or extra-axial collection. No evidence of pituitary lesion. Vascular: No vascular calcification.  No hyperdense vessels. Skull: Normal.  No fracture or focal bone lesion. Sinuses/Orbits: Visualized sinuses are clear. No fluid in the middle ears or mastoids. Visualized orbits are normal. Other: None significant CT CERVICAL SPINE FINDINGS Alignment: Normal Skull base and vertebrae: Normal.  No fracture. Soft tissues and spinal canal: No soft tissue injury seen. Disc levels: Degenerative arthropathy at the C1-2 articulation on the left. Degenerative spondylosis with endplate osteophytes and shallow disc protrusions at C3-4, C4-5, C5-6 and C6-7. 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 Foraminal stenosis because of these degenerative changes. Most pronounced on the left at C4-5, C5-6 and C6-7. Upper chest: Negative Other: None IMPRESSION: Head CT:  Normal.  No traumatic finding. Cervical spine CT: No acute or traumatic finding. Degenerative changes as outlined above. Electronically Signed   By: Paulina FusiMark  Shogry M.D.   On: 08/16/2016 13:31   Dg Hips Bilat W Or Wo Pelvis 3-4 Views  Result Date: 08/16/2016 CLINICAL DATA:  horse's head collided with her own and knocked her out today, causing her to fall from the horse. Pt c/o lower right side chest pain, posterior right hip pain and low back pain. Hx of HTN, DM. No hx of lumbar or pelvic injuries or surgeries. EXAM: DG HIP (WITH OR WITHOUT PELVIS) 3-4V BILAT  COMPARISON:  None. FINDINGS: Minimally comminuted minimally displaced fracture of the right pubic bone, involving the superior and inferior pubic rami. No other pelvic ring fracture. Bilateral hips unremarkable. Bilateral pelvic phleboliths incidentally noted. IMPRESSION: 1. Minimally displaced right pubic bone fracture involving superior and inferior rami. Electronically Signed   By: Corlis Leak  Hassell M.D.   On: 08/16/2016 13:02    Patient seen by me along with physician assistant. Patient was brought in the right hand EMS after a fall from the horse. Patient was leaning forward when the head of the horse race suddenly striking her in the head resulting in a loss of consciousness. Patient also with an L3 fracture. Patient's been evaluated by neurosurgery no acute concerns there. Patient also with a pelvic fracture. Was evaluated by orthopedics. Patient was able to ambulate with the pelvic fracture. However patient went to the bathroom here and had a unexplained syncopal episode. Based on this setting patient warrants medical admission with cardiac monitoring overnight. Could be related to a postconcussive syndrome. Patient does not normally pass out.   Vanetta MuldersZackowski, Anson Peddie, MD 08/16/16 [phone]

## 2016-08-16 NOTE — ED Triage Notes (Signed)
[facility].  Per [facility] pt fell from horse and landed on feet then hit her sacral area.  Denies LOC.  Pt complains of right side flank pain and right inner thigh pain.  PT is A/O x4.  PTA vitals CBG 182, BP 144/82, SPO2 98% on RA, pulse 990, NSR

## 2016-08-16 NOTE — ED Provider Notes (Signed)
[facility]-EMERGENCY DEPT Provider Note   CSN: [id number] Arrival date & time: 08/16/16  1143     History   Chief Complaint Chief Complaint  Patient presents with  . Fall    HPI Tonishia MARALEE HIGUCHI is a 61 y.o. female.  HPI 61 year old female past medical history significant for diabetes, hypertension, hyperlipidemia presents to the ED today after sustaining a fall off of a horse. Patient states that she was riding her horse and the ring today when she fell from a horse landing onto her feet and hitting her sacral area. EMS states that there is no LOC however discussed with patient states he states that she passed out and does not remember the event. Patient complains of right hip pain with movement. She also complains of low back pain and neck pain. Moving makes the pain worse. At this moment patient denies any pain. She was placed in a c-collar by EMS. Patient denies any headaches, vision changes, lightheadedness, dizziness, chest pain, shortness of breath, abdominal pain, urinary symptoms. Pt denies any ha, night sweats, hx of ivdu/cancer, loss or bowel or bladder, urinary retention, saddle paresthesias, lower extremity paresthesias.   Past Medical History:  Diagnosis Date  . Diabetes mellitus without complication (HCC)   . Hyperlipemia   . Hypertension     Patient Active Problem List   Diagnosis Date Noted  . ACUTE SINUSITIS, UNSPECIFIED 06/10/2009  . VERTIGO 06/10/2009  . SLEEP APNEA, CHRONIC 06/10/2009  . NAUSEA ALONE 06/10/2009  . BRONCHITIS 04/02/2009  . DIABETES MELLITUS, TYPE I 03/22/2009  . HYPERLIPIDEMIA 03/22/2009  . HYPERTENSION 03/22/2009  . NECK PAIN, ACUTE 03/22/2009  . BACK PAIN, THORACIC REGION, LEFT 03/22/2009    Past Surgical History:  Procedure Laterality Date  . ABDOMINAL HYSTERECTOMY    . ABDOMINOPLASTY    . CESAREAN SECTION    . KNEE SURGERY      OB History    No data available       Home Medications    Prior to Admission medications     Medication Sig Start Date End Date Taking? Authorizing Provider  aspirin 81 MG tablet Take 81 mg by mouth daily.    [provider]  azithromycin (ZITHROMAX Z-PAK) 250 MG tablet Two tablets first day then one a day. 01/30/14   Elson Areas, PA-C  co-enzyme Q-10 30 MG capsule Take 30 mg by mouth 3 (three) times daily.    [provider]  GLIMEPIRIDE PO Take by mouth.    [provider]  lisinopril (PRINIVIL,ZESTRIL) 5 MG tablet Take 5 mg by mouth daily.    [provider]  metFORMIN (GLUCOPHAGE) 500 MG tablet Take 500 mg by mouth 2 (two) times daily with a meal.    [provider]  ondansetron (ZOFRAN ODT) 4 MG disintegrating tablet Take 1 tablet (4 mg total) by mouth every 8 (eight) hours as needed for nausea or vomiting. 01/30/14   Elson Areas, PA-C  simvastatin (ZOCOR) 40 MG tablet Take 40 mg by mouth every evening.    [provider]    Family History Family History  Problem Relation Age of Onset  . Hypertension Mother   . Stroke Mother   . Cancer Mother   . Heart attack Father   . Diabetes Father     Social History Social History  Substance Use Topics  . Smoking status: Never Smoker  . Smokeless tobacco: Never Used  . Alcohol use No     Allergies   Codeine;  Eggs or egg-derived products; Morphine and related; and Penicillins   Review of Systems Review of Systems  Constitutional: Negative for chills and fever.  HENT: Negative for congestion.   Eyes: Negative for visual disturbance.  Respiratory: Negative for cough and shortness of breath.   Cardiovascular: Negative for chest pain.  Gastrointestinal: Negative for abdominal pain, diarrhea, nausea and vomiting.  Genitourinary: Negative for dysuria, flank pain, frequency, hematuria, urgency, vaginal bleeding and vaginal discharge.  Musculoskeletal: Positive for arthralgias, back pain, myalgias and neck pain.  Skin: Negative for rash and wound.  Neurological:  Positive for syncope. Negative for dizziness, weakness, light-headedness, numbness and headaches.  Psychiatric/Behavioral: Negative for sleep disturbance. The patient is not nervous/anxious.      Physical Exam Updated Vital Signs BP (!) 142/74   Pulse 80   Temp 98.8 F (37.1 C) (Oral)   Resp 16   SpO2 98%   Physical Exam Physical Exam  Constitutional: Pt is oriented to person, place, and time. Appears well-developed and well-nourished. No distress.  HENT:  Head: Normocephalic and atraumatic.  Ears: No bilateral hemotympanum. Nose: Nose normal. No septal hematoma. Mouth/Throat: Uvula is midline, oropharynx is clear and moist and mucous membranes are normal.  Eyes: Conjunctivae and EOM are normal. Pupils are equal, round, and reactive to light.  Neck: No spinous process tenderness and no muscular tenderness present. No rigidity. Normal range of motion present.   patient in c-collar  Mild midline cervical tenderness No crepitus, deformity or step-offs No paraspinal tenderness  Cardiovascular: Normal rate, regular rhythm and intact distal pulses.   Pulses:      Radial pulses are 2+ on the right side, and 2+ on the left side.       Dorsalis pedis pulses are 2+ on the right side, and 2+ on the left side.       Posterior tibial pulses are 2+ on the right side, and 2+ on the left side.  Pulmonary/Chest: Effort normal and breath sounds normal. No accessory muscle usage. No respiratory distress. No decreased breath sounds. No wheezes. No rhonchi. No rales. Exhibits no tenderness and no bony tenderness.  No ecchymosis.  No flail segment, crepitus or deformity Equal chest expansion  Abdominal: Soft. Normal appearance and bowel sounds are normal. There is no tenderness. There is no rigidity, no guarding and no CVA tenderness.  No ecchymosis.  Abd soft and nontender  Musculoskeletal: Normal range of motion.       Thoracic back: Exhibits normal range of motion.       Lumbar back: Exhibits  normal range of motion.  Full range of motion of the T-spine and L-spine No tenderness to palpation of the spinous processes of the T-spine or L-spine No crepitus, deformity or step-offs Mild tenderness to palpation of the paraspinous muscles of the L-spine  The patient does have some tenderness to palpation of the right SI joint with movement. No obvious deformity, ecchymosis. DP pulses are 2+ bilaterally. Strength in lower extremities are 5 out of 5 bilaterally. Sensation intact. Cap refill is normal.  Lymphadenopathy:    Pt has no cervical adenopathy.  Neurological: Pt is alert and oriented to person, place, and time. Normal reflexes. No cranial nerve deficit. GCS eye subscore is 4. GCS verbal subscore is 5. GCS motor subscore is 6.  Reflex Scores:      Bicep reflexes are 2+ on the right side and 2+ on the left side.      Brachioradialis reflexes are 2+ on the right side  and 2+ on the left side.      Patellar reflexes are 2+ on the right side and 2+ on the left side.      Achilles reflexes are 2+ on the right side and 2+ on the left side. Speech is clear and goal oriented, follows commands Normal 5/5 strength in upper and lower extremities bilaterally including dorsiflexion and plantar flexion, strong and equal grip strength Sensation normal to light and sharp touch Moves extremities without ataxia, coordination intact Normal gait and balance No Clonus  Skin: Skin is warm and dry. No rash noted. Pt is not diaphoretic. No erythema.  Psychiatric: Normal mood and affect.  Nursing note and vitals reviewed.     ED Treatments / Results  Labs (all labs ordered are listed, but only abnormal results are displayed) Labs Reviewed  CBC WITH DIFFERENTIAL/PLATELET - Abnormal; Notable for the following:       Result Value   WBC 13.0 (*)    Neutro Abs 9.7 (*)    All other components within normal limits  BASIC METABOLIC PANEL  I-STAT TROPONIN, ED    EKG  EKG Interpretation None        Radiology Dg Chest 2 View  Result Date: 08/16/2016 CLINICAL DATA:  Horse riding injury. EXAM: CHEST  2 VIEW COMPARISON:  No prior. FINDINGS: Mediastinum hilar structures normal. Lungs are clear. No pleural effusion pneumothorax. Heart size normal. No acute bony abnormality. Degenerative changes thoracic spine. IMPRESSION: No acute abnormality. Electronically Signed   By: Maisie Fus  Register   On: 08/16/2016 13:01   Dg Lumbar Spine Complete  Result Date: 08/16/2016 CLINICAL DATA:  horse's head collided with her own and knocked her out today, causing her to fall from the horse. Pt c/o lower right side chest pain, posterior right hip pain and low back pain. Hx of HTN, DM. No hx of lumbar or pelvic injuries or surgeries. EXAM: LUMBAR SPINE - COMPLETE 4+ VIEW COMPARISON:  None. FINDINGS: There is subtle contour angulation deformity of the anterior cortex of the L3 vertebral body, suggesting mild compression fracture. No significant loss of height. No retropulsion is evident. Posterior elements appear intact. Alignment is preserved. No other acute findings. Interspaces preserved throughout. Early anterior endplate spurring M5-H8. No other significant osseous degenerative change. Bilateral pelvic phleboliths. IMPRESSION: 1. Possible mild L3 compression fracture without significant loss of height or retropulsion. Electronically Signed   By: Corlis Leak M.D.   On: 08/16/2016 13:06   Ct Head Wo Contrast  Result Date: 08/16/2016 CLINICAL DATA:  Larey Seat from horse today.  Head and neck pain. EXAM: CT HEAD WITHOUT CONTRAST CT CERVICAL SPINE WITHOUT CONTRAST TECHNIQUE: Multidetector CT imaging of the head and cervical spine was performed following the standard protocol without intravenous contrast. Multiplanar CT image reconstructions of the cervical spine were also generated. COMPARISON:  Radiography 03/22/2009 FINDINGS: CT HEAD FINDINGS Brain: No evidence of malformation, atrophy, old or acute small or large vessel  infarction, mass lesion, hemorrhage, hydrocephalus or extra-axial collection. No evidence of pituitary lesion. Vascular: No vascular calcification.  No hyperdense vessels. Skull: Normal.  No fracture or focal bone lesion. Sinuses/Orbits: Visualized sinuses are clear. No fluid in the middle ears or mastoids. Visualized orbits are normal. Other: None significant CT CERVICAL SPINE FINDINGS Alignment: Normal Skull base and vertebrae: Normal.  No fracture. Soft tissues and spinal canal: No soft tissue injury seen. Disc levels: Degenerative arthropathy at the C1-2 articulation on the left. Degenerative spondylosis with endplate osteophytes and shallow disc protrusions at  C3-4, C4-5, C5-6 and C6-7. 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 Foraminal stenosis because of these degenerative changes. Most pronounced on the left at C4-5, C5-6 and C6-7. Upper chest: Negative Other: None IMPRESSION: Head CT:  Normal.  No traumatic finding. Cervical spine CT: No acute or traumatic finding. Degenerative changes as outlined above. Electronically Signed   By: Paulina Fusi M.D.   On: 08/16/2016 13:31   Ct Cervical Spine Wo Contrast  Result Date: 08/16/2016 CLINICAL DATA:  Larey Seat from horse today.  Head and neck pain. EXAM: CT HEAD WITHOUT CONTRAST CT CERVICAL SPINE WITHOUT CONTRAST TECHNIQUE: Multidetector CT imaging of the head and cervical spine was performed following the standard protocol without intravenous contrast. Multiplanar CT image reconstructions of the cervical spine were also generated. COMPARISON:  Radiography 03/22/2009 FINDINGS: CT HEAD FINDINGS Brain: No evidence of malformation, atrophy, old or acute small or large vessel infarction, mass lesion, hemorrhage, hydrocephalus or extra-axial collection. No evidence of pituitary lesion. Vascular: No vascular calcification.  No hyperdense vessels. Skull: Normal.  No fracture or focal bone lesion. Sinuses/Orbits: Visualized sinuses are clear. No fluid in the middle ears  or mastoids. Visualized orbits are normal. Other: None significant CT CERVICAL SPINE FINDINGS Alignment: Normal Skull base and vertebrae: Normal.  No fracture. Soft tissues and spinal canal: No soft tissue injury seen. Disc levels: Degenerative arthropathy at the C1-2 articulation on the left. Degenerative spondylosis with endplate osteophytes and shallow disc protrusions at C3-4, C4-5, C5-6 and C6-7. 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 Foraminal stenosis because of these degenerative changes. Most pronounced on the left at C4-5, C5-6 and C6-7. Upper chest: Negative Other: None IMPRESSION: Head CT:  Normal.  No traumatic finding. Cervical spine CT: No acute or traumatic finding. Degenerative changes as outlined above. Electronically Signed   By: Paulina Fusi M.D.   On: 08/16/2016 13:31   Dg Hips Bilat W Or Wo Pelvis 3-4 Views  Result Date: 08/16/2016 CLINICAL DATA:  horse's head collided with her own and knocked her out today, causing her to fall from the horse. Pt c/o lower right side chest pain, posterior right hip pain and low back pain. Hx of HTN, DM. No hx of lumbar or pelvic injuries or surgeries. EXAM: DG HIP (WITH OR WITHOUT PELVIS) 3-4V BILAT COMPARISON:  None. FINDINGS: Minimally comminuted minimally displaced fracture of the right pubic bone, involving the superior and inferior pubic rami. No other pelvic ring fracture. Bilateral hips unremarkable. Bilateral pelvic phleboliths incidentally noted. IMPRESSION: 1. Minimally displaced right pubic bone fracture involving superior and inferior rami. Electronically Signed   By: Corlis Leak M.D.   On: 08/16/2016 13:02    Procedures Procedures (including critical care time)  Medications Ordered in ED Medications  ondansetron (ZOFRAN) injection 4 mg (not administered)  acetaminophen (TYLENOL) tablet 1,000 mg (1,000 mg Oral Given 08/16/16 1421)  sodium chloride 0.9 % bolus 1,000 mL (1,000 mLs Intravenous New Bag/Given 08/16/16 1553)     Initial  Impression / Assessment and Plan / ED Course  I have reviewed the triage vital signs and the nursing notes.  Pertinent labs & imaging results that were available during my care of the patient were reviewed by me and considered in my medical decision making (see chart for details).     Patient presents to the ED today after being hit in the head by a horse cause her to have a syncopal episode of falling off a horse lying on her right hip. On initial exam patient  was well-appearing on complained of mild pain in her right hip. Vital signs are reassuring. Imaging was obtained as patient was placed in a c-collar. CT of head and neck were without abnormalities. She did have a notable L3 compression fracture that was followed by neurosurgery who recommends TSO brace and follow up outpatient setting. A pelvis x-ray noted a pelvic rami fracture. Orthopedics was counseled to and they recommended pain management with a walker and will follow up outpatient setting if patient is able to weight-bear. Patient was able to walk to the bathroom and walked to the bathroom she had a second syncopal episode in the ED that was witnessed by the nurse. Nurse call the patient lowered her to the ground states that she was out for 2-3 seconds. Patient denies any chest pain, shortness of breath with this episode. Repeat vital signs are reassuring. Basic lab work was obtained that shows no acute abnormalities. Troponin was negative. EKG shows no ischemic changes. Given patient's second syncopal episode with unknown cause feel the patient would benefit from observation the hospital.  Internal medicine teaching service who agrees to admit patient will come to the ED to evaluated patient as admission orders. Patient has hematuria that his table this time was up-to-date on plan of care. Patient was discussed with Dr. Elbert EwingsZackoswki  was agreeable to above plan.  Final Clinical Impressions(s) / ED Diagnoses   Final diagnoses:  Syncope,  unspecified syncope type  Closed compression fracture of third lumbar vertebra, initial encounter (HCC)  Closed fracture of right pubis, unspecified portion of pubis, initial encounter [facility](HCC)    New Prescriptions New Prescriptions   No medications on file     Wallace KellerLeaphart, Theophil Thivierge T, PA-C 08/16/16 1659    Lavera GuiseLiu, Dana Duo, MD 08/16/16 1730

## 2016-08-16 NOTE — H&P (Signed)
Date: 08/16/2016               Patient Name:  Katherine Luna MRN: [id number]  DOB: Mar 29, 1955 Age / Sex: 61 y.o., female   PCP: Deloris Pingyter-Brown, Sherry M, MD         Medical Service: Internal Medicine Teaching Service         Attending Physician: Dr. Doneen PoissonKlima, Lawrence, MD    First Contact: Dr. Saunders RevelNedrud Pager: [phone]  Second Contact: Dr. Karma GreaserBoswell Pager: [phone]       After Hours (After 5p/  First Contact Pager: [phone]  weekends / holidays): Second Contact Pager: [phone]   Chief Complaint: Fall from horse  History of Present Illness:  Katherine Luna is a 61 y.o f with pmh of dm, htn, hld who presented subsequently following falling off a horse earlier this morning. The patient does not recall what happened and the history was provided by family member at bedside. The patient was riding a horse and the horse's face hit her face, she lost her balance and fell on her right side. She states that he has pain in her right shoulder and right hip. She denies any numbness/tingling, sob, or chest pain.  Later on in the emergency room the patient felt dizzy and fell on the floor. She said that she had accompanied nausea, but no vomiting during the time. She stated that she did not have any sob or palpitations during that time.   Meds:  Current Meds  Medication Sig  . Ascorbic Acid (VITAMIN C) 100 MG tablet Take 100 mg by mouth daily.  Marland Kitchen. aspirin 81 MG tablet Take 81 mg by mouth daily.  . citalopram (CELEXA) 20 MG tablet Take 20 mg by mouth at bedtime.  . hydrochlorothiazide (MICROZIDE) 12.5 MG capsule Take 12.5 mg by mouth daily.  Marland Kitchen. lisinopril (PRINIVIL,ZESTRIL) 30 MG tablet Take 30 mg by mouth daily.  Marland Kitchen. QSYMIA 7.5-46 MG CP24 Take 1 tablet by mouth every other day.  . simvastatin (ZOCOR) 40 MG tablet Take 40 mg by mouth every evening.  . SitaGLIPtin-MetFORMIN HCl (JANUMET XR) 50-1000 MG TB24 Take 1 tablet by mouth 2 (two) times daily.   . vitamin B-12 (CYANOCOBALAMIN) 500 MCG tablet Take 500 mcg by  mouth daily.     Allergies: Allergies as of 08/16/2016 - Review Complete 08/16/2016  Allergen Reaction Noted  . Codeine Nausea And Vomiting and Other (See Comments) 03/22/2009  . Eggs or egg-derived products Other (See Comments) 11/09/2011  . Morphine and related Other (See Comments) 11/09/2011  . Penicillins Rash 03/22/2009   Past Medical History:  Diagnosis Date  . Diabetes mellitus without complication (HCC)   . Hyperlipemia   . Hypertension     Family History:   Mother-htn, stroke, cancer Father-heart attack, diabetes  Social History: never smoked, used alcohol, no illicit drug use  Review of Systems: A complete ROS was negative except as per HPI.   Physical Exam: Blood pressure (!) 144/80, pulse 62, temperature 98.8 F (37.1 C), temperature source Oral, resp. rate 16, SpO2 99 %.  Physical Exam  Constitutional: She is oriented to person, place, and time. She appears well-developed and well-nourished. She appears distressed.  HENT:  Head: Normocephalic and atraumatic.  Eyes: Conjunctivae are normal.  Cardiovascular: Normal rate, regular rhythm, normal heart sounds and intact distal pulses.   No murmur heard. Pulmonary/Chest: Effort normal and breath sounds normal. No respiratory distress. She has no wheezes.  Abdominal: Soft. Bowel sounds are normal. She exhibits  no distension.  Musculoskeletal: She exhibits tenderness (in right hip ). She exhibits no edema.       Right shoulder: She exhibits decreased range of motion (to move right arm above head).  5/5 strength upper and lower extremity  Neurological: She is alert and oriented to person, place, and time. No cranial nerve deficit or sensory deficit. She exhibits normal muscle tone.   EKG: Normal sinus rhythm  CXR: No acute abnormality  CT Cervical Spine:  Head CT:  Normal.  No traumatic finding.  Cervical spine CT: No acute or traumatic finding. Degenerative changes as outlined above.  DG Hips bilat w or  w/o pelvis Minimally displaced right pubic bone fracture involving superior and inferior rami.  DG Lumbar Spine Possible mild L3 compression fracture without significant loss of height or retropulsion.   Assessment & Plan by Problem:  Fall from horse L3 Compression fracture Right pubic bone fracture The patient has a mild compression fracture at the L3 level and right pubic bone which are not operative. -TLSO brace was recommended by neurosurgery -PT/OT consult -Tylenol 650mg  q6hrs and toradol 30mg  iv q6hrs prn -Start lovenox 40mg  -Normal saline   Syncope Postconcussion syndrome Patient had a syncopal episode when walking to the bathroom.  -Orthostatic vital signs EKG was normal  CT head normal   Essential Hypertension Continue hctz 12.5mg  qd and lisinopril 30mg  qd  HLD Continue simvastatin  Diabetes Mellitus The patient has been taking janumet outpatient -[facility]  -Continue glucose checks  Dispo: Admit patient to Inpatient with expected length of stay greater than 2 midnights.  Signed: Lorenso CourierVahini Juel Bellerose, MD Internal Medicine PGY1 Pager:[phone] 08/16/2016, 7:18 PM

## 2016-08-16 NOTE — Consult Note (Signed)
Reason for Consult:Pubis fx Referring Physician: D Joseph Pierini Katherine Luna is an 61 y.o. female.  HPI: Katherine Luna was riding a horse when she was thrown, landing on her right side. She is amnestic to the event. When she came to and tried to walk she had significant anterior pelvic pain and came to the ED for evaluation. X-rays showed a right pubis fracture and a minimal L3 compression fx. Orthopedic surgery was consulted.  Past Medical History:  Diagnosis Date  . Diabetes mellitus without complication (HCC)   . Hyperlipemia   . Hypertension     Past Surgical History:  Procedure Laterality Date  . ABDOMINAL HYSTERECTOMY    . ABDOMINOPLASTY    . CESAREAN SECTION    . KNEE SURGERY      Family History  Problem Relation Age of Onset  . Hypertension Mother   . Stroke Mother   . Cancer Mother   . Heart attack Father   . Diabetes Father     Social History:  reports that she has never smoked. She has never used smokeless tobacco. She reports that she does not drink alcohol or use drugs.  Allergies:  Allergies  Allergen Reactions  . Codeine     REACTION: GI Upset  . Eggs Or Egg-Derived Products   . Morphine And Related   . Penicillins     REACTION: Rash (x1 time only), has taken Amox with no problem    Medications: I have reviewed the patient's current medications.  No results found for this or any previous visit (from the past 48 hour(s)).  Dg Chest 2 View  Result Date: 08/16/2016 CLINICAL DATA:  Horse riding injury. EXAM: CHEST  2 VIEW COMPARISON:  No prior. FINDINGS: Mediastinum hilar structures normal. Lungs are clear. No pleural effusion pneumothorax. Heart size normal. No acute bony abnormality. Degenerative changes thoracic spine. IMPRESSION: No acute abnormality. Electronically Signed   By: Maisie Fus  Register   On: 08/16/2016 13:01   Dg Lumbar Spine Complete  Result Date: 08/16/2016 CLINICAL DATA:  horse's head collided with her own and knocked her out today, causing her to fall  from the horse. Pt c/o lower right side chest pain, posterior right hip pain and low back pain. Hx of HTN, DM. No hx of lumbar or pelvic injuries or surgeries. EXAM: LUMBAR SPINE - COMPLETE 4+ VIEW COMPARISON:  None. FINDINGS: There is subtle contour angulation deformity of the anterior cortex of the L3 vertebral body, suggesting mild compression fracture. No significant loss of height. No retropulsion is evident. Posterior elements appear intact. Alignment is preserved. No other acute findings. Interspaces preserved throughout. Early anterior endplate spurring U0-A5. No other significant osseous degenerative change. Bilateral pelvic phleboliths. IMPRESSION: 1. Possible mild L3 compression fracture without significant loss of height or retropulsion. Electronically Signed   By: Corlis Leak M.D.   On: 08/16/2016 13:06   Ct Head Wo Contrast  Result Date: 08/16/2016 CLINICAL DATA:  Larey Seat from horse today.  Head and neck pain. EXAM: CT HEAD WITHOUT CONTRAST CT CERVICAL SPINE WITHOUT CONTRAST TECHNIQUE: Multidetector CT imaging of the head and cervical spine was performed following the standard protocol without intravenous contrast. Multiplanar CT image reconstructions of the cervical spine were also generated. COMPARISON:  Radiography 03/22/2009 FINDINGS: CT HEAD FINDINGS Brain: No evidence of malformation, atrophy, old or acute small or large vessel infarction, mass lesion, hemorrhage, hydrocephalus or extra-axial collection. No evidence of pituitary lesion. Vascular: No vascular calcification.  No hyperdense vessels. Skull: Normal.  No fracture  or focal bone lesion. Sinuses/Orbits: Visualized sinuses are clear. No fluid in the middle ears or mastoids. Visualized orbits are normal. Other: None significant CT CERVICAL SPINE FINDINGS Alignment: Normal Skull base and vertebrae: Normal.  No fracture. Soft tissues and spinal canal: No soft tissue injury seen. Disc levels: Degenerative arthropathy at the C1-2 articulation  on the left. Degenerative spondylosis with endplate osteophytes and shallow disc protrusions at C3-4, C4-5, C5-6 and C6-7. 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 Foraminal stenosis because of these degenerative changes. Most pronounced on the left at C4-5, C5-6 and C6-7. Upper chest: Negative Other: None IMPRESSION: Head CT:  Normal.  No traumatic finding. Cervical spine CT: No acute or traumatic finding. Degenerative changes as outlined above. Electronically Signed   By: Paulina FusiMark  Shogry M.D.   On: 08/16/2016 13:31   Ct Cervical Spine Wo Contrast  Result Date: 08/16/2016 CLINICAL DATA:  Larey SeatFell from horse today.  Head and neck pain. EXAM: CT HEAD WITHOUT CONTRAST CT CERVICAL SPINE WITHOUT CONTRAST TECHNIQUE: Multidetector CT imaging of the head and cervical spine was performed following the standard protocol without intravenous contrast. Multiplanar CT image reconstructions of the cervical spine were also generated. COMPARISON:  Radiography 03/22/2009 FINDINGS: CT HEAD FINDINGS Brain: No evidence of malformation, atrophy, old or acute small or large vessel infarction, mass lesion, hemorrhage, hydrocephalus or extra-axial collection. No evidence of pituitary lesion. Vascular: No vascular calcification.  No hyperdense vessels. Skull: Normal.  No fracture or focal bone lesion. Sinuses/Orbits: Visualized sinuses are clear. No fluid in the middle ears or mastoids. Visualized orbits are normal. Other: None significant CT CERVICAL SPINE FINDINGS Alignment: Normal Skull base and vertebrae: Normal.  No fracture. Soft tissues and spinal canal: No soft tissue injury seen. Disc levels: Degenerative arthropathy at the C1-2 articulation on the left. Degenerative spondylosis with endplate osteophytes and shallow disc protrusions at C3-4, C4-5, C5-6 and C6-7. 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 Foraminal stenosis because of these degenerative changes. Most pronounced on the left at C4-5, C5-6 and C6-7. Upper chest: Negative  Other: None IMPRESSION: Head CT:  Normal.  No traumatic finding. Cervical spine CT: No acute or traumatic finding. Degenerative changes as outlined above. Electronically Signed   By: Paulina FusiMark  Shogry M.D.   On: 08/16/2016 13:31   Dg Hips Bilat W Or Wo Pelvis 3-4 Views  Result Date: 08/16/2016 CLINICAL DATA:  horse's head collided with her own and knocked her out today, causing her to fall from the horse. Pt c/o lower right side chest pain, posterior right hip pain and low back pain. Hx of HTN, DM. No hx of lumbar or pelvic injuries or surgeries. EXAM: DG HIP (WITH OR WITHOUT PELVIS) 3-4V BILAT COMPARISON:  None. FINDINGS: Minimally comminuted minimally displaced fracture of the right pubic bone, involving the superior and inferior pubic rami. No other pelvic ring fracture. Bilateral hips unremarkable. Bilateral pelvic phleboliths incidentally noted. IMPRESSION: 1. Minimally displaced right pubic bone fracture involving superior and inferior rami. Electronically Signed   By: Corlis Leak  Hassell M.D.   On: 08/16/2016 13:02    Review of Systems  Musculoskeletal: Positive for joint pain (Right hip).   Blood pressure 135/70, pulse 68, temperature 98.8 F (37.1 C), temperature source Oral, resp. rate 16, SpO2 100 %. Physical Exam  Constitutional: She appears well-developed and well-nourished. No distress.  HENT:  Head: Normocephalic.  Eyes: Conjunctivae are normal. Right eye exhibits no discharge. Left eye exhibits no discharge. No scleral icterus.  Cardiovascular: Normal rate and regular rhythm.  Respiratory: Effort normal. No respiratory distress.  Musculoskeletal:       Lumbar back: She exhibits no tenderness and no bony tenderness.  Pelvis--no traumatic wounds or rash, no ecchymosis, stable to manual stress, TTP right pubis/hip  BLE No traumatic wounds, ecchymosis, or rash  Nontender  No effusions  Knee stable to varus/ valgus and anterior/posterior stress  Sens DPN, SPN, TN intact  Motor EHL, ext,  flex, evers 5/5  DP 2+, PT 1+, No significant edema  Neurological: She is alert.  Skin: Skin is warm and dry. She is not diaphoretic.  Psychiatric: She has a normal mood and affect. Her behavior is normal.    Assessment/Plan: Right pubis fx -- Can WBAT, will likely need RW or cane at first. Trial of ambulation once NS gives opinion on L3 fx. F/u with Dr. Aundria Rudogers in office in 3 weeks. L3 fx Concussion    Freeman CaldronMichael J. Falon Huesca, PA-C Orthopedic Surgery [phone] 08/16/2016, 2:28 PM

## 2016-08-16 NOTE — Consult Note (Signed)
Chief Complaint   Chief Complaint  Patient presents with  . Fall    HPI   HPI: Katherine Luna is a 61 y.o. female who presented to ER after falling off a horse. Husband at bedside reports horse went to make a sharp cut and patient subsequently hit head-to-head with horse. Resulted in immediate LOC <1 min per husband. Landed on right side. Pt unable to recall any of the events. Does complain of midline lower back pain and right hip pain. Back pain is mild in severity. Non-radiating. No associated N/T of lower extremities. Denis bowel or bladder dysfunction. No history of lumbar surgery. Not on anticoagulant.    Patient Active Problem List   Diagnosis Date Noted  . ACUTE SINUSITIS, UNSPECIFIED 06/10/2009  . VERTIGO 06/10/2009  . SLEEP APNEA, CHRONIC 06/10/2009  . NAUSEA ALONE 06/10/2009  . BRONCHITIS 04/02/2009  . DIABETES MELLITUS, TYPE I 03/22/2009  . HYPERLIPIDEMIA 03/22/2009  . HYPERTENSION 03/22/2009  . NECK PAIN, ACUTE 03/22/2009  . BACK PAIN, THORACIC REGION, LEFT 03/22/2009    PMH: Past Medical History:  Diagnosis Date  . Diabetes mellitus without complication (HCC)   . Hyperlipemia   . Hypertension     PSH: Past Surgical History:  Procedure Laterality Date  . ABDOMINAL HYSTERECTOMY    . ABDOMINOPLASTY    . CESAREAN SECTION    . KNEE SURGERY       (Not in a hospital admission)  SH: Social History  Substance Use Topics  . Smoking status: Never Smoker  . Smokeless tobacco: Never Used  . Alcohol use No    MEDS: Prior to Admission medications   Medication Sig Start Date End Date Taking? Authorizing Provider  Ascorbic Acid (VITAMIN C) 100 MG tablet Take 100 mg by mouth daily.   Yes [provider]  aspirin 81 MG tablet Take 81 mg by mouth daily.   Yes [provider]  citalopram (CELEXA) 20 MG tablet Take 20 mg by mouth at bedtime. 07/25/16  Yes [provider]  hydrochlorothiazide (MICROZIDE) 12.5 MG capsule Take 12.5 mg by mouth  daily. 07/26/16  Yes [provider]  lisinopril (PRINIVIL,ZESTRIL) 30 MG tablet Take 30 mg by mouth daily. 08/16/16  Yes [provider]  QSYMIA 7.5-46 MG CP24 Take 1 tablet by mouth every other day. 07/18/16  Yes [provider]  simvastatin (ZOCOR) 40 MG tablet Take 40 mg by mouth every evening.   Yes [provider]  SitaGLIPtin-MetFORMIN HCl (JANUMET XR) 50-1000 MG TB24 Take 1 tablet by mouth 2 (two) times daily.  05/23/16  Yes [provider]  vitamin B-12 (CYANOCOBALAMIN) 500 MCG tablet Take 500 mcg by mouth daily. 07/15/16  Yes [provider]  azithromycin (ZITHROMAX Z-PAK) 250 MG tablet Two tablets first day then one a day. Patient not taking: Reported on 08/16/2016 01/30/14   Elson Areas, PA-C  ondansetron (ZOFRAN ODT) 4 MG disintegrating tablet Take 1 tablet (4 mg total) by mouth every 8 (eight) hours as needed for nausea or vomiting. Patient not taking: Reported on 08/16/2016 01/30/14   Elson Areas, PA-C    ALLERGY: Allergies  Allergen Reactions  . Codeine Nausea And Vomiting and Other (See Comments)    REACTION: GI Upset REACTION: GI Upset  . Eggs Or Egg-Derived Products Other (See Comments)    Sour stomach  . Morphine And Related Other (See Comments)    "feels like bugs are crawling all over body"  . Penicillins Rash    REACTION:  Rash (x1 time only), has taken Amox with no problem Has patient had a PCN reaction causing immediate rash, facial/tongue/throat swelling, SOB or lightheadedness with hypotension: Yes Has patient had a PCN reaction causing severe rash involving mucus membranes or skin necrosis: Yes Has patient had a PCN reaction that required hospitalization: No Has patient had a PCN reaction occurring within the last 10 years: No If all of the above answers are "NO", then may proceed with Cephalosporin us    Social History  Substance Use Topics  . Smoking status: Never Smoker  . Smokeless tobacco: Never Used   . Alcohol use No     Family History  Problem Relation Age of Onset  . Hypertension Mother   . Stroke Mother   . Cancer Mother   . Heart attack Father   . Diabetes Father      ROS   Review of Systems  Constitutional: Negative.   HENT: Negative.   Eyes: Negative.   Respiratory: Negative.   Cardiovascular: Negative.   Gastrointestinal: Negative.   Genitourinary: Negative.   Musculoskeletal: Positive for back pain and myalgias.  Skin: Negative.   Neurological: Negative for dizziness, tingling, tremors, sensory change, speech change, focal weakness, seizures, loss of consciousness and headaches.    Exam   Vitals:   08/16/16 1148 08/16/16 1400  BP: (!) 142/74 135/70  Pulse: 80 68  Resp: 16   Temp: 98.8 F (37.1 C)    General appearance: WDWN, NAD Eyes: PERRL Cardiovascular: Regular rate and rhythm without murmurs, rubs, gallops. No edema or variciosities. Distal pulses normal. Pulmonary: Clear to auscultation Musculoskeletal:     Muscle tone upper extremities: Normal    Muscle tone lower extremities: Normal    Motor exam: Upper Extremities Deltoid Bicep Tricep Grip  Right 5/5 5/5 5/5 5/5  Left 5/5 5/5 5/5 5/5   Lower Extremity IP Quad PF DF EHL  Right 4/5 due to pain 5/5 5/5 5/5 5/5  Left 5/5 5/5 5/5 5/5 5/5   Neurological Awake, alert, oriented Memory and concentration grossly intact Speech fluent, appropriate CNII: Visual fields normal CNIII/IV/VI: EOMI CNV: Facial sensation normal CNVII: Symmetric, normal strength CNVIII: Grossly normal CNIX: Normal palate movement CNXI: Trap and SCM strength normal CN XII: Tongue protrusion normal Sensation grossly intact to LT DTR: Normal Coordination (finger/nose & heel/shin): Normal  Results - Imaging/Labs   No results found for this or any previous visit (from the past 48 hour(s)).  Dg Chest 2 View  Result Date: 08/16/2016 CLINICAL DATA:  Horse riding injury. EXAM: CHEST  2 VIEW COMPARISON:  No prior.  FINDINGS: Mediastinum hilar structures normal. Lungs are clear. No pleural effusion pneumothorax. Heart size normal. No acute bony abnormality. Degenerative changes thoracic spine. IMPRESSION: No acute abnormality. Electronically Signed   By: Maisie Fushomas  Register   On: 08/16/2016 13:01   Dg Lumbar Spine Complete  Result Date: 08/16/2016 CLINICAL DATA:  horse's head collided with her own and knocked her out today, causing her to fall from the horse. Pt c/o lower right side chest pain, posterior right hip pain and low back pain. Hx of HTN, DM. No hx of lumbar or pelvic injuries or surgeries. EXAM: LUMBAR SPINE - COMPLETE 4+ VIEW COMPARISON:  None. FINDINGS: There is subtle contour angulation deformity of the anterior cortex of the L3 vertebral body, suggesting mild compression fracture. No significant loss of height. No retropulsion is evident. Posterior elements appear intact. Alignment is preserved. No other acute findings. Interspaces preserved throughout. Early anterior endplate  spurring L2-S1. No other significant osseous degenerative change. Bilateral pelvic phleboliths. IMPRESSION: 1. Possible mild L3 compression fracture without significant loss of height or retropulsion. Electronically Signed   By: Corlis Leak M.D.   On: 08/16/2016 13:06   Ct Head Wo Contrast  Result Date: 08/16/2016 CLINICAL DATA:  Larey Seat from horse today.  Head and neck pain. EXAM: CT HEAD WITHOUT CONTRAST CT CERVICAL SPINE WITHOUT CONTRAST TECHNIQUE: Multidetector CT imaging of the head and cervical spine was performed following the standard protocol without intravenous contrast. Multiplanar CT image reconstructions of the cervical spine were also generated. COMPARISON:  Radiography 03/22/2009 FINDINGS: CT HEAD FINDINGS Brain: No evidence of malformation, atrophy, old or acute small or large vessel infarction, mass lesion, hemorrhage, hydrocephalus or extra-axial collection. No evidence of pituitary lesion. Vascular: No vascular  calcification.  No hyperdense vessels. Skull: Normal.  No fracture or focal bone lesion. Sinuses/Orbits: Visualized sinuses are clear. No fluid in the middle ears or mastoids. Visualized orbits are normal. Other: None significant CT CERVICAL SPINE FINDINGS Alignment: Normal Skull base and vertebrae: Normal.  No fracture. Soft tissues and spinal canal: No soft tissue injury seen. Disc levels: Degenerative arthropathy at the C1-2 articulation on the left. Degenerative spondylosis with endplate osteophytes and shallow disc protrusions at C3-4, C4-5, C5-6 and C6-7. 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 Foraminal stenosis because of these degenerative changes. Most pronounced on the left at C4-5, C5-6 and C6-7. Upper chest: Negative Other: None IMPRESSION: Head CT:  Normal.  No traumatic finding. Cervical spine CT: No acute or traumatic finding. Degenerative changes as outlined above. Electronically Signed   By: Paulina Fusi M.D.   On: 08/16/2016 13:31   Ct Cervical Spine Wo Contrast  Result Date: 08/16/2016 CLINICAL DATA:  Larey Seat from horse today.  Head and neck pain. EXAM: CT HEAD WITHOUT CONTRAST CT CERVICAL SPINE WITHOUT CONTRAST TECHNIQUE: Multidetector CT imaging of the head and cervical spine was performed following the standard protocol without intravenous contrast. Multiplanar CT image reconstructions of the cervical spine were also generated. COMPARISON:  Radiography 03/22/2009 FINDINGS: CT HEAD FINDINGS Brain: No evidence of malformation, atrophy, old or acute small or large vessel infarction, mass lesion, hemorrhage, hydrocephalus or extra-axial collection. No evidence of pituitary lesion. Vascular: No vascular calcification.  No hyperdense vessels. Skull: Normal.  No fracture or focal bone lesion. Sinuses/Orbits: Visualized sinuses are clear. No fluid in the middle ears or mastoids. Visualized orbits are normal. Other: None significant CT CERVICAL SPINE FINDINGS Alignment: Normal Skull base and  vertebrae: Normal.  No fracture. Soft tissues and spinal canal: No soft tissue injury seen. Disc levels: Degenerative arthropathy at the C1-2 articulation on the left. Degenerative spondylosis with endplate osteophytes and shallow disc protrusions at C3-4, C4-5, C5-6 and C6-7. 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 Foraminal stenosis because of these degenerative changes. Most pronounced on the left at C4-5, C5-6 and C6-7. Upper chest: Negative Other: None IMPRESSION: Head CT:  Normal.  No traumatic finding. Cervical spine CT: No acute or traumatic finding. Degenerative changes as outlined above. Electronically Signed   By: Paulina Fusi M.D.   On: 08/16/2016 13:31   Dg Hips Bilat W Or Wo Pelvis 3-4 Views  Result Date: 08/16/2016 CLINICAL DATA:  horse's head collided with her own and knocked her out today, causing her to fall from the horse. Pt c/o lower right side chest pain, posterior right hip pain and low back pain. Hx of HTN, DM. No hx of lumbar or pelvic injuries  or surgeries. EXAM: DG HIP (WITH OR WITHOUT PELVIS) 3-4V BILAT COMPARISON:  None. FINDINGS: Minimally comminuted minimally displaced fracture of the right pubic bone, involving the superior and inferior pubic rami. No other pelvic ring fracture. Bilateral hips unremarkable. Bilateral pelvic phleboliths incidentally noted. IMPRESSION: 1. Minimally displaced right pubic bone fracture involving superior and inferior rami. Electronically Signed   By: Corlis Leak  Hassell M.D.   On: 08/16/2016 13:02    Impression/Plan   61 y.o. female with mild L3 compression fracture without significant height loss or retropulsion. She is neurologically intact with exception of right hip weakness secondary to right pubic bone fracture. This lumbar fracture is non-operative. Treatment goal is symptomatic relief.  - Pain control - LSO brace when upright and OOB - Follow up 4 weeks outpt for repeat imaging - Strong precautions including red flag symptoms discussed with  pt and husband

## 2016-08-16 NOTE — ED Notes (Addendum)
Ortho provider at  bedside ?

## 2016-08-17 ENCOUNTER — Observation Stay (HOSPITAL_COMMUNITY): Payer: BC Managed Care – PPO

## 2016-08-17 DIAGNOSIS — S32501A Unspecified fracture of right pubis, initial encounter for closed fracture: Secondary | ICD-10-CM | POA: Diagnosis not present

## 2016-08-17 DIAGNOSIS — W5512XA Struck by horse, initial encounter: Secondary | ICD-10-CM | POA: Diagnosis not present

## 2016-08-17 DIAGNOSIS — S32039A Unspecified fracture of third lumbar vertebra, initial encounter for closed fracture: Secondary | ICD-10-CM | POA: Diagnosis not present

## 2016-08-17 DIAGNOSIS — S069X9A Unspecified intracranial injury with loss of consciousness of unspecified duration, initial encounter: Secondary | ICD-10-CM | POA: Diagnosis not present

## 2016-08-17 LAB — GLUCOSE, CAPILLARY
GLUCOSE-CAPILLARY: 189 mg/dL — AB (ref 65–99)
GLUCOSE-CAPILLARY: 145 mg/dL — AB (ref 65–99)
Glucose-Capillary: 117 mg/dL — ABNORMAL HIGH (ref 65–99)
Glucose-Capillary: 134 mg/dL — ABNORMAL HIGH (ref 65–99)

## 2016-08-17 LAB — BASIC METABOLIC PANEL
Anion gap: 7 (ref 5–15)
BUN: 7 mg/dL (ref 6–20)
CHLORIDE: 105 mmol/L (ref 101–111)
CO2: 25 mmol/L (ref 22–32)
Calcium: 8.5 mg/dL — ABNORMAL LOW (ref 8.9–10.3)
Creatinine, Ser: 1.04 mg/dL — ABNORMAL HIGH (ref 0.44–1.00)
GFR calc non Af Amer: 57 mL/min — ABNORMAL LOW (ref >60)
GLUCOSE: 176 mg/dL — AB (ref 65–99)
POTASSIUM: 3.6 mmol/L (ref 3.5–5.1)
SODIUM: 137 mmol/L (ref 135–145)

## 2016-08-17 LAB — HIV ANTIBODY (ROUTINE TESTING W REFLEX): HIV SCREEN 4TH GENERATION: NONREACTIVE

## 2016-08-17 LAB — CBC
HEMATOCRIT: 36.2 % (ref 36.0–46.0)
Hemoglobin: 11.8 g/dL — ABNORMAL LOW (ref 12.0–15.0)
MCH: 26.3 pg (ref 26.0–34.0)
MCHC: 32.6 g/dL (ref 30.0–36.0)
MCV: 80.6 fL (ref 78.0–100.0)
Platelets: 283 10*3/uL (ref 150–400)
RBC: 4.49 MIL/uL (ref 3.87–5.11)
RDW: 13.7 % (ref 11.5–15.5)
WBC: 8.7 10*3/uL (ref 4.0–10.5)

## 2016-08-17 MED ORDER — OXYCODONE HCL 5 MG PO TABS
10.0000 mg | ORAL_TABLET | Freq: Once | ORAL | Status: AC
  Administered 2016-08-17: 10 mg via ORAL
  Filled 2016-08-17: qty 2

## 2016-08-17 MED ORDER — KETOROLAC TROMETHAMINE 30 MG/ML IJ SOLN
30.0000 mg | Freq: Four times a day (QID) | INTRAMUSCULAR | Status: DC
  Administered 2016-08-18 (×2): 30 mg via INTRAVENOUS
  Filled 2016-08-17 (×2): qty 1

## 2016-08-17 MED ORDER — OXYCODONE HCL 5 MG PO TABS
5.0000 mg | ORAL_TABLET | Freq: Once | ORAL | Status: AC
  Administered 2016-08-17: 5 mg via ORAL
  Filled 2016-08-17: qty 1

## 2016-08-17 MED ORDER — OXYCODONE HCL 5 MG PO TABS
10.0000 mg | ORAL_TABLET | Freq: Four times a day (QID) | ORAL | Status: DC | PRN
  Administered 2016-08-17: 10 mg via ORAL
  Filled 2016-08-17: qty 2

## 2016-08-17 NOTE — Progress Notes (Signed)
Internal medicine called back. Will order oxy 5mg  and will assess for effectiveness.

## 2016-08-17 NOTE — Progress Notes (Signed)
Orthopedic Tech Progress Note Patient Details:  Katherine Luna [birth year] [id number]  Patient ID: Katherine Luna, female   DOB: [birth year], 61 y.o.   MRN: [id number]   Katherine Luna, Katherine Luna 08/17/2016, 9:47 AM Called in bio-tech brace order; [facility]

## 2016-08-17 NOTE — Progress Notes (Signed)
PT Cancellation Note  Patient Details Name: Katherine Luna MRN: [id number] DOB: 10/25/1955   Cancelled Treatment:    Reason Eval/Treat Not Completed: Medical issues which prohibited therapy.  Pt's TLSO has not arrived.  Will check back later.   [name] 08/17/2016, 9:44 AM

## 2016-08-17 NOTE — Progress Notes (Signed)
OT Cancellation Note  Patient Details Name: Katherine HailDawn M Ozanich MRN: [id number] DOB: [birth year]   Cancelled Treatment:    Reason Eval/Treat Not Completed: Fatigue/lethargy limiting ability to participate.  Just finished with PT.  Will reattempt.  Charle Mclaurin [place]arpe, OTR/L [phone]   Jeani HawkingConarpe, [name] 08/17/2016, 2:48 PM

## 2016-08-17 NOTE — Progress Notes (Signed)
   Subjective:  [name] has complaints of right sided rib pain this morning. Worse with deep inspiration. She also reports headache. She has not gotten out of bed today and reports that she is waiting on the back brace from neurosurgery before she gets out of bed. Pain was an issue overnight and she reports that the Toradol and Oxy IR together helped with her pain. Did not have significant improvement with them separately.   This afternoon, she reports she does not feel comfortable going home today. Would like OT evaluation in the morning.   Objective:  Vital signs in last 24 hours: Vitals:   08/17/16 0439 08/17/16 1211 08/17/16 1442 08/17/16 1443  BP: 122/63 120/64 117/67 124/76  Pulse: 65 69    Resp: 20 18    Temp: 98.6 F (37 C) 98 F (36.7 C)    TempSrc: Oral Oral    SpO2: 96% 98%    Weight: 159 lb 4.8 oz (72.3 kg)     Height:       Physical Exam  Constitutional: She is oriented to person, place, and time and well-developed, well-nourished, and in no distress.  HENT:  Head: Normocephalic and atraumatic.  No lacerations or brusing  Cardiovascular: Normal rate, regular rhythm and intact distal pulses.   Pulmonary/Chest: Effort normal and breath sounds normal.  Tenderness to palpation over right rib cage. No erythema, lacerations or bruising present.   Neurological: She is alert and oriented to person, place, and time.  Moving all extremities, no focal deficits. Sensation intact.   Skin: Skin is warm and dry.  Vitals reviewed.  Assessment/Plan:  Syncopal Episode following TBI with concussion:  No further episodes. Patient denies any lightheadedness, dizziness, nausea today. Denies any personal or family history of arrhythmias. Telemetry with NSR overnight. Orthostatics never done but low suspicion for orthostatic syncope. Suspect post concussive syncopal episode. No further work up warranted currently. Patient stable for discharge today.   Fall from horse with subsequent  L3 compression fracture, right pubic bone fracture and right 4th and 6th rib fractures: Continue TLSO brace per neurosurgery recs. Ambulate as tolerated. Patient with complaints of right sided rib pain. Initial CXR on admission with no obvious fracture. Repeat dedicated XR today demonstrates 4th and 6th rib fractures.  PT evaluation today with no recommendations for follow up PT. OT unable to evaluate today.  Patient complaints of headache and pain from multiple sites. Giving Toradol, Tylenol, and Oxy IR 10 prn. Patient not comfortable leaving today due to pain and ambulation difficulty. Will observe tonight and d/c home tomorrow.   DM Type 2: Continue SSI  Dispo: Anticipated discharge in approximately 1 day(s).   Valentino NoseBoswell, Eustacio Ellen, MD 08/17/2016, 6:32 PM Pager: [phone]

## 2016-08-17 NOTE — Progress Notes (Signed)
Pt is alert and oriented with unrelieved headache, Ice Pack helps new orders for x-ray and [employer] notified and Brace is in Route from [employer].

## 2016-08-17 NOTE — Progress Notes (Signed)
Pt is alert and oriented stated that she is having a different type of burniing intense pain on top of head medication  Given will notify MD

## 2016-08-17 NOTE — Progress Notes (Signed)
Orthopedic Tech Progress Note Patient Details:  Katherine Luna [birth year] [id number]  Patient ID: Katherine Luna, female   DOB: [birth year], 61 y.o.   MRN: [id number]   Katherine Luna, Katherine Luna 08/17/2016, 9:45 AM Called in bio-tech brace order; spoke with [facility][name]

## 2016-08-17 NOTE — Evaluation (Signed)
Physical Therapy Evaluation Patient Details Name: Katherine Luna MRN: [id number] DOB: 1955/10/12 Today's Date: 08/17/2016   History of Present Illness  Katherine Luna was riding a horse when she was thrown, landing on her right side. She is amnestic to the event. When she came to and tried to walk she had significant anterior pelvic pain and came to the ED for evaluation. X-rays showed a right pubis fracture and a minimal L3 compression fx. Pt also with R 4th and 6th rib fx. Pt had syncopal episode in ER due to possible post-concussive syndrome.  Clinical Impression  Pt admitted with above diagnosis. Pt currently with functional limitations due to the deficits listed below (see PT Problem List).  Pt will benefit from skilled PT to increase their independence and safety with mobility to allow discharge to the venue listed below.  Pt able to do short distance gait in room with RW and TLSO intact.  Pt did become nauseous with gait.  Spoke with OT who will be working with pt tomorrow and she is going to do more education on concussions.  Pt and husband educated on back precautions and use of TLSO. At this time, feel that pt does not need PT follow up, but if she doesn't progress as anticipated may need HHPT.     Follow Up Recommendations No PT follow up    Equipment Recommendations  None recommended by PT    Recommendations for Other Services       Precautions / Restrictions Precautions Precautions: Back Precaution Booklet Issued: Yes (comment) Required Braces or Orthoses: Spinal Brace Spinal Brace: Thoracolumbosacral orthotic Restrictions Weight Bearing Restrictions: Yes RLE Weight Bearing: Weight bearing as tolerated (per ortho notes)      Mobility  Bed Mobility Overal bed mobility: Needs Assistance Bed Mobility: Rolling;Sidelying to Sit Rolling: Supervision Sidelying to sit: Supervision       General bed mobility comments: S with cues for technique  Transfers Overall transfer level:  Needs assistance Equipment used: Rolling walker (2 wheeled) Transfers: Sit to/from [employer];Stand Pivot Transfers Sit to Stand: Min guard Stand pivot transfers: Min guard       General transfer comment: cues for technique  Ambulation/Gait Ambulation/Gait assistance: Min guard Ambulation Distance (Feet): 8 Feet Assistive device: Rolling walker (2 wheeled) Gait Pattern/deviations: Decreased step length - left;Decreased stance time - right;Antalgic   Gait velocity interpretation: Below normal speed for age/gender General Gait Details: Pt with antalgic gait pattern, but motivated to do short distance. Pt became nauseous after gait. Pt given cold drink and washcloth and began feeling better.  Stairs            Wheelchair Mobility    Modified Rankin (Stroke Patients Only)       Balance Overall balance assessment: Needs assistance   Sitting balance-Leahy Scale: Good       Standing balance-Leahy Scale: Poor Standing balance comment: requires RW                             Pertinent Vitals/Pain Pain Assessment: Faces Faces Pain Scale: Hurts even more Pain Location: R groin Pain Descriptors / Indicators: Grimacing;Guarding Pain Intervention(s): Limited activity within patient's tolerance;Monitored during session;Premedicated before session;Repositioned    Home Living Family/patient expects to be discharged to:: Private residence Living Arrangements: Spouse/significant other Available Help at Discharge: Family Type of Home: House Home Access: Ramped entrance     Home Layout: One level Home Equipment: Environmental consultantWalker - 2 wheels;Cane - single  point      Prior Function Level of Independence: Independent               Hand Dominance        Extremity/Trunk Assessment   Upper Extremity Assessment Upper Extremity Assessment: Defer to OT evaluation    Lower Extremity Assessment Lower Extremity Assessment: Overall WFL for tasks assessed;RLE  deficits/detail RLE: Unable to fully assess due to pain    Cervical / Trunk Assessment Cervical / Trunk Assessment: Other exceptions Cervical / Trunk Exceptions: TLSO intact  Communication   Communication: No difficulties  Cognition Arousal/Alertness: Awake/alert Behavior During Therapy: WFL for tasks assessed/performed Overall Cognitive Status: Within Functional Limits for tasks assessed                                        General Comments General comments (skin integrity, edema, etc.): Pt reports TLSO helps her pain level.    Exercises     Assessment/Plan    PT Assessment Patient needs continued PT services  PT Problem List Decreased activity tolerance;Decreased balance;Pain;Decreased mobility;Decreased knowledge of use of DME;Decreased knowledge of precautions       PT Treatment Interventions DME instruction;Gait training;Functional mobility training;Therapeutic activities;Therapeutic exercise;Balance training    PT Goals (Current goals can be found in the Care Plan section)  Acute Rehab PT Goals Patient Stated Goal: get back on her horse PT Goal Formulation: With patient Time For Goal Achievement: 08/31/16 Potential to Achieve Goals: Good    Frequency Min 5X/week   Barriers to discharge        Co-evaluation               AM-PAC PT "6 Clicks" Daily Activity  Outcome Measure Difficulty turning over in bed (including adjusting bedclothes, sheets and blankets)?: A Little Difficulty moving from lying on back to sitting on the side of the bed? : A Lot Difficulty sitting down on and standing up from a chair with arms (e.g., wheelchair, bedside commode, etc,.)?: A Little Help needed moving to and from a bed to chair (including a wheelchair)?: A Little Help needed walking in hospital room?: A Little Help needed climbing 3-5 steps with a railing? : A Little 6 Click Score: 17    End of Session Equipment Utilized During Treatment: Back  brace;Gait belt Activity Tolerance: Treatment limited secondary to medical complications (Comment);Patient limited by pain (nauseous) Patient left: in chair;with call bell/phone within reach;with family/visitor present Nurse Communication: Mobility status PT Visit Diagnosis: Difficulty in walking, not elsewhere classified (R26.2);Unsteadiness on feet (R26.81)    Time: [id number] PT Time Calculation (min) (ACUTE ONLY): 30 min   Charges:   PT Evaluation $PT Eval Moderate Complexity: 1 Mod PT Treatments $Therapeutic Activity: 8-22 mins   PT G Codes:   PT G-Codes **NOT FOR INPATIENT CLASS** Functional Assessment Tool Used: AM-PAC 6 Clicks Basic Mobility;Clinical judgement Functional Limitation: Mobility: Walking and moving around Mobility: Walking and Moving Around Current Status ([id number]): At least 40 percent but less than 60 percent impaired, limited or restricted Mobility: Walking and Moving Around Goal Status [phone]): At least 1 percent but less than 20 percent impaired, limited or restricted    Clydie BraunKaren L. Katrinka BlazingSmith, [place]PT Pager [phone] 08/17/2016   Enzo MontgomeryKaren L Jetaime Pinnix 08/17/2016, 2:50 PM

## 2016-08-17 NOTE — Progress Notes (Signed)
Patient arrived to floor via stretcher, alert and oriented x 4 , accompanied by husband. Patient admitted to ED from EMS d/t a fall from a horse. Per ED report, pt sustained a concusion, pelvis fracture and fracture of L3.  Has History of DM, HLD , HTN and allergies to  Codeine, eggs, morphine and pcn.  Placed on cardiac box 01 and also external female catheter for the night. TLSO brace ordered from orthopedics, and will be brought to patient in the am.  Patient is High fall risk. Bed alarm on, husband at bedside. Pt refused low bed. Medicated with tylenol and toradol, with relief. Safety maintained. Will continue to monitor.

## 2016-08-18 DIAGNOSIS — S32501A Unspecified fracture of right pubis, initial encounter for closed fracture: Secondary | ICD-10-CM | POA: Diagnosis not present

## 2016-08-18 DIAGNOSIS — S069X9A Unspecified intracranial injury with loss of consciousness of unspecified duration, initial encounter: Secondary | ICD-10-CM | POA: Diagnosis not present

## 2016-08-18 DIAGNOSIS — S32039A Unspecified fracture of third lumbar vertebra, initial encounter for closed fracture: Secondary | ICD-10-CM | POA: Diagnosis not present

## 2016-08-18 DIAGNOSIS — W5512XA Struck by horse, initial encounter: Secondary | ICD-10-CM | POA: Diagnosis not present

## 2016-08-18 LAB — GLUCOSE, CAPILLARY
GLUCOSE-CAPILLARY: 134 mg/dL — AB (ref 65–99)
GLUCOSE-CAPILLARY: 164 mg/dL — AB (ref 65–99)

## 2016-08-18 MED ORDER — IBUPROFEN 800 MG PO TABS: 800.0000 mg | ORAL_TABLET | Freq: Three times a day (TID) | ORAL | 1 refills | Status: AC | PRN

## 2016-08-18 MED ORDER — OXYCODONE HCL 10 MG PO TABS: 10.0000 mg | ORAL_TABLET | Freq: Four times a day (QID) | ORAL | 0 refills | Status: AC | PRN

## 2016-08-18 NOTE — Progress Notes (Signed)
Physical Therapy Treatment Patient Details Name: Katherine Luna MRN: [id number] DOB: Oct 20, 1955 Today's Date: 08/18/2016    History of Present Illness Katherine Luna was riding a horse when she was thrown, landing on her right side. She is amnestic to the event. When she came to and tried to walk she had significant anterior pelvic pain and came to the ED for evaluation. X-rays showed a right pubis fracture and a minimal L3 compression fx. Pt also with R 4th and 6th rib fx. Pt had syncopal episode in ER due to possible post-concussive syndrome.    PT Comments    Patient is making gradual progress toward mobility goals and tolerated short distance gait of 6412ft this session. Pt required min guard/min A overall for mobility. Pt continues to c/o dizziness and nausea at times with mobility. Husband present and helpful. Continue to progress as tolerated.     Follow Up Recommendations  No PT follow up     Equipment Recommendations  None recommended by PT (pt reported having all equipment at home)    Recommendations for Other Services       Precautions / Restrictions Precautions Precautions: Back Precaution Booklet Issued: No Precaution Comments: precautions reviewed with pt Required Braces or Orthoses: Spinal Brace Spinal Brace: Thoracolumbosacral orthotic Restrictions Weight Bearing Restrictions: Yes RLE Weight Bearing: Weight bearing as tolerated    Mobility  Bed Mobility Overal bed mobility: Needs Assistance Bed Mobility: Rolling;Sidelying to Sit Rolling: Min guard Sidelying to sit: Min assist       General bed mobility comments: cues for sequencing and technique; rolled toward L side due to R side too painful; assist to elevate trunk into sitting and to bring bilat LE all the way off EOB  Transfers Overall transfer level: Needs assistance Equipment used: Rolling walker (2 wheeled) Transfers: Sit to/from Stand Sit to Stand: Min guard         General transfer comment: cues for  safe hand placement and technique; extra time taken upon standing due to dizziness  Ambulation/Gait Ambulation/Gait assistance: Min guard Ambulation Distance (Feet): 12 Feet Assistive device: Rolling walker (2 wheeled) Gait Pattern/deviations: Step-to pattern;Decreased stance time - right;Decreased step length - left;Decreased step length - right;Decreased weight shift to right;Antalgic     General Gait Details: cues for posture and weight shifting; pt with slow, antalgic gait   Stairs            Wheelchair Mobility    Modified Rankin (Stroke Patients Only)       Balance Overall balance assessment: Needs assistance Sitting-balance support: Feet supported;No upper extremity supported Sitting balance-Leahy Scale: Good     Standing balance support: Bilateral upper extremity supported Standing balance-Leahy Scale: Poor Standing balance comment: RW for support                            Cognition Arousal/Alertness: Awake/alert Behavior During Therapy: WFL for tasks assessed/performed Overall Cognitive Status: Within Functional Limits for tasks assessed                                 General Comments: still having difficutly with memory; asked what day it is and what day "this" happened      Exercises      General Comments General comments (skin integrity, edema, etc.): husband present       Pertinent Vitals/Pain Pain Assessment: Faces Faces Pain Scale: Hurts whole  lot Pain Location: R side pelvis, ribs, and back Pain Descriptors / Indicators: Grimacing;Guarding;Aching;Sore Pain Intervention(s): Limited activity within patient's tolerance;Monitored during session;Premedicated before session;Repositioned    Home Living                      Prior Function            PT Goals (current goals can now be found in the care plan section) Acute Rehab PT Goals Patient Stated Goal: enjoy retirement Progress towards PT goals:  Progressing toward goals    Frequency    Min 5X/week      PT Plan Current plan remains appropriate    Co-evaluation              AM-PAC PT "6 Clicks" Daily Activity  Outcome Measure  Difficulty turning over in bed (including adjusting bedclothes, sheets and blankets)?: Total Difficulty moving from lying on back to sitting on the side of the bed? : Total Difficulty sitting down on and standing up from a chair with arms (e.g., wheelchair, bedside commode, etc,.)?: Total Help needed moving to and from a bed to chair (including a wheelchair)?: A Little Help needed walking in hospital room?: A Little Help needed climbing 3-5 steps with a railing? : A Lot 6 Click Score: 11    End of Session Equipment Utilized During Treatment: Back brace;Gait belt Activity Tolerance: Patient tolerated treatment well Patient left: with call bell/phone within reach;with family/visitor present;Other (comment) (pt sitting on commode with husband present) Nurse Communication: Mobility status PT Visit Diagnosis: Difficulty in walking, not elsewhere classified (R26.2);Unsteadiness on feet (R26.81)     Time: [id number] PT Time Calculation (min) (ACUTE ONLY): 41 min  Charges:  $Gait Training: 8-22 mins $Therapeutic Activity: 23-37 mins                    G Codes:       Katherine Luna, PTA Pager: [phone]     Katherine Luna 08/18/2016, 3:49 PM

## 2016-08-18 NOTE — Progress Notes (Signed)
   Subjective:  Ms. Castor states that she feels better today and that her pain is much improved with her scheduled Tordal and PRN oxycodone. She states that she was able to sleep for the first time in a few days and is happy for the rest. She feels better today and thinks that she may be strong enough to go home today.  Objective:  Vital signs in last 24 hours: Vitals:   08/17/16 1443 08/17/16 2041 08/18/16 0521 08/18/16 1203  BP: 124/76 119/66 127/77 126/77  Pulse:  67 74 65  Resp:  '18 18 18  '$ Temp:  98.1 F (36.7 C) 98.7 F (37.1 C) 98.1 F (36.7 C)  TempSrc:  Oral Oral Oral  SpO2:  96% 93% 99%  Weight:   160 lb 6.4 oz (72.8 kg)   Height:       Physical Exam  Constitutional: She is oriented to person, place, and time and well-developed, well-nourished, and in no distress.  Patient sitting comfortably in bed wearing brace in no acute distress.  HENT:  Head: Normocephalic and atraumatic.  No lacerations or brusing  Cardiovascular: Normal rate, regular rhythm and intact distal pulses.   Pulmonary/Chest: Effort normal and breath sounds normal. No respiratory distress. She has no wheezes.  Musculoskeletal: She exhibits no edema (of bilateral lower extremities).  Neurological: She is alert and oriented to person, place, and time.  Moving all extremities, no focal deficits. Sensation intact.   Skin: Skin is warm and dry. No rash noted. No erythema.  Vitals reviewed.  Assessment/Plan:  Korina Tretter is a 61 yo with a PMH of HTN, HLD, and T2DM who presented to the ED for evaluation after a horse riding accident where she and her horse collided heads and she subsequently fell on her right side. Her Head CT was negative for acute trauma but the  patient lost consciousness, has memory difficulties around the event, and endorses headaches, all suggesting TBI with concussion. The patient had a syncopal episode in the ED, which was likely a vasovagal episode 2/2 pain with ambulation as her  cardiac workup was unremarkable.   Syncopal Episode following TBI with concussion: Patient continues to endorse headaches, which are relieved with pain medication. Denies changes in vision. Patient reports some diaphoresis and dizziness when working with PT yesterday. Patient has had no further syncopal episodes. She is able to ambulate with assistance.  - Patient stable for discharge today.   Fall from horse with subsequent L3 compression fracture, right pubic bone fracture and right 4th and 6th rib fractures: Patient's pain was well controlled over night with oral medications. - Continue TLSO brace per neurosurgery recs.  - Ambulate as tolerated.  - Discharge with scheduled ibuprofen 800 mg q8 hours and 10 mg oxycodone q6 hours for 5 days for pain management of multiple orthopedic injuries - PT evaluation today with no recommendations for follow up PT per patient request on 8/1 - OT evaluation suggests [place] evaluation to make sure goals are met as outpatient on 8/2 - F/u with orthopedics and neurosurgery as outpatient  DM Type 2: -Continue SSI  Dispo: Anticipated discharge in approximately 1 day.   Thomasene Ripple, MD 08/18/2016, 1:42 PM Pager: [phone]

## 2016-08-18 NOTE — Progress Notes (Signed)
Orders received for pt discharge.  Discharge summary printed and reviewed with pt.  Explained medication regimen, and pt had no further questions at this time.  IV removed and site remains clean, dry, intact.  Telemetry removed.   Husband is unsure that pt should be discharged, r/t her concussion.  States that he "understands about the fractures and that they cannot be operated on.  I am worried about her becoming dizzy and passing out." I reviewed CT scan results with him, and pt then stated that she thought that she was ready for discharge. Pt in stable condition and awaiting transport.

## 2016-08-18 NOTE — Plan of Care (Signed)
Problem: Education: Goal: Knowledge of [facility] Education information/materials will improve Outcome: Progressing Patient verbalized understanding  Problem: Safety: Goal: Ability to remain free from injury will improve Outcome: Progressing Patient verbalized understanding  Problem: Health Behavior/Discharge Planning: Goal: Ability to manage health-related needs will improve Outcome: Progressing Patient verbalized understanding  Problem: Pain Managment: Goal: General experience of comfort will improve Outcome: Progressing Patient verbalized understanding  Problem: Physical Regulation: Goal: Ability to maintain clinical measurements within normal limits will improve Outcome: Progressing Patient verbalized understanding Goal: Will remain free from infection Outcome: Progressing Patient verbalized understanding  Problem: Skin Integrity: Goal: Risk for impaired skin integrity will decrease Outcome: Progressing Patient verbalized understanding  Problem: Tissue Perfusion: Goal: Risk factors for ineffective tissue perfusion will decrease Outcome: Progressing Patient verbalized understanding  Problem: Activity: Goal: Risk for activity intolerance will decrease Outcome: Progressing Patient verbalized understanding  Problem: Nutrition: Goal: Adequate nutrition will be maintained Outcome: Progressing Patient verbalized understanding  Problem: Bowel/Gastric: Goal: Will not experience complications related to bowel motility Outcome: Progressing Patient verbalized understanding

## 2016-08-18 NOTE — Evaluation (Signed)
Occupational Therapy Evaluation Patient Details Name: Donnetta HailDawn M Sanborn MRN: [id number] DOB: 08/09/1955 Today's Date: 08/18/2016    History of Present Illness Janvi was riding a horse when she was thrown, landing on her right side. She is amnestic to the event. When she came to and tried to walk she had significant anterior pelvic pain and came to the ED for evaluation. X-rays showed a right pubis fracture and a minimal L3 compression fx. Pt also with R 4th and 6th rib fx. Pt had syncopal episode in ER due to possible post-concussive syndrome.   Clinical Impression   Pt reports she was independent with ADL PTA. Currently pt overall min guard for functional mobility and min-max assist for ADL. Currently pts biggest limiting factor toward independence and safety with ADL and functional mobility is pain. Pt planning to d/c home with 24/7 supervision initially. Pt would benefit from continued skilled OT to address established goals.    Follow Up Recommendations  No OT follow up;Supervision/Assistance - 24 hour    Equipment Recommendations  3 in 1 bedside commode;Tub/shower bench (husband reports he will get equipment)    Recommendations for Other Services       Precautions / Restrictions Precautions Precautions: Back Precaution Booklet Issued: No Precaution Comments: Educated pt on back precautions Required Braces or Orthoses: Spinal Brace Spinal Brace: Thoracolumbosacral orthotic Restrictions Weight Bearing Restrictions: Yes RLE Weight Bearing: Weight bearing as tolerated      Mobility Bed Mobility               General bed mobility comments: Pt OOB on BSC upon arrival  Transfers Overall transfer level: Needs assistance Equipment used: Rolling walker (2 wheeled) Transfers: Sit to/from Stand Sit to Stand: Min guard         General transfer comment: for safety; good hand placement and technique. Increased time with dizziness with sit to stand    Balance Overall balance  assessment: Needs assistance Sitting-balance support: Feet supported;No upper extremity supported Sitting balance-Leahy Scale: Good     Standing balance support: Bilateral upper extremity supported Standing balance-Leahy Scale: Poor Standing balance comment: RW for support                           ADL either performed or assessed with clinical judgement   ADL Overall ADL's : Needs assistance/impaired Eating/Feeding: Set up;Sitting   Grooming: Set up;Sitting   Upper Body Bathing: Minimal assistance;Sitting   Lower Body Bathing: Moderate assistance;Sit to/from stand   Upper Body Dressing : Minimal assistance;Sitting Upper Body Dressing Details (indicate cue type and reason): to adjust TLSO Lower Body Dressing: Maximal assistance Lower Body Dressing Details (indicate cue type and reason): to don socks. Discussed proper technique for LB dressing. Husband to assist as needed Toilet Transfer: Min guard;Ambulation;BSC;RW Toilet Transfer Details (indicate cue type and reason): Educated on use of 3 in 1 over toilet or next to bed as needed Toileting- ArchitectClothing Manipulation and Hygiene: Set up;Supervision/safety;Sitting/lateral lean Toileting - Clothing Manipulation Details (indicate cue type and reason): for peri care   Tub/Shower Transfer Details (indicate cue type and reason): Educated pt on tub transfer technique. Discussed tub bench; husband reports he will look into it. Recommend not getting in shower initially; performing sponge baths initially Functional mobility during ADLs: Min guard;Rolling walker       Vision         Perception     Praxis      Pertinent Vitals/Pain Pain Assessment:  Faces Faces Pain Scale: Hurts whole lot Pain Location: R groin, R side, back Pain Descriptors / Indicators: Grimacing;Guarding;Aching Pain Intervention(s): Monitored during session;Repositioned     Hand Dominance     Extremity/Trunk Assessment Upper Extremity  Assessment Upper Extremity Assessment: Overall WFL for tasks assessed   Lower Extremity Assessment Lower Extremity Assessment: Defer to PT evaluation   Cervical / Trunk Assessment Cervical / Trunk Assessment: Other exceptions Cervical / Trunk Exceptions: TLSO intact   Communication Communication Communication: No difficulties   Cognition Arousal/Alertness: Awake/alert Behavior During Therapy: WFL for tasks assessed/performed Overall Cognitive Status: Within Functional Limits for tasks assessed                                 General Comments: Pt reporting she cannot remember fall. Also reporting she feels foggy with events in the hospital-cannot remember medication given to her on multiple occasions.   General Comments       Exercises     Shoulder Instructions      Home Living Family/patient expects to be discharged to:: Private residence Living Arrangements: Spouse/significant other Available Help at Discharge: Family;Available 24 hours/day Type of Home: House Home Access: Ramped entrance     Home Layout: One level     Bathroom Shower/Tub: Tub/shower unit;Walk-in shower   Bathroom Toilet: Handicapped height     Home Equipment: Environmental consultantWalker - 2 wheels;Cane - single point;Hand held shower head;Shower seat          Prior Functioning/Environment Level of Independence: Independent                 OT Problem List: Decreased strength;Decreased activity tolerance;Impaired balance (sitting and/or standing);Decreased knowledge of use of DME or AE;Decreased knowledge of precautions;Pain      OT Treatment/Interventions: Self-care/ADL training;Energy conservation;DME and/or AE instruction;Therapeutic activities;Patient/family education;Balance training    OT Goals(Current goals can be found in the care plan section) Acute Rehab OT Goals Patient Stated Goal: enjoy retirement OT Goal Formulation: With patient/family Time For Goal Achievement:  09/01/16 Potential to Achieve Goals: Good ADL Goals Pt Will Perform Lower Body Bathing: with supervision;sit to/from stand Pt Will Perform Lower Body Dressing: with supervision;sit to/from stand Pt Will Transfer to Toilet: with supervision;ambulating;bedside commode Pt Will Perform Toileting - Clothing Manipulation and hygiene: with supervision;sit to/from stand Pt Will Perform Tub/Shower Transfer: with supervision;Tub transfer;Shower transfer;ambulating;shower seat;tub bench;rolling walker Additional ADL Goal #1: Pt will independently verablly recall 3/3 back precautions and maintain throughout ADL.  OT Frequency: Min 2X/week   Barriers to D/C:            Co-evaluation              AM-PAC PT "6 Clicks" Daily Activity     Outcome Measure Help from another person eating meals?: None Help from another person taking care of personal grooming?: A Little Help from another person toileting, which includes using toliet, bedpan, or urinal?: A Little Help from another person bathing (including washing, rinsing, drying)?: A Lot Help from another person to put on and taking off regular upper body clothing?: A Little Help from another person to put on and taking off regular lower body clothing?: A Lot 6 Click Score: 17   End of Session Equipment Utilized During Treatment: Gait belt;Rolling walker;Back brace  Activity Tolerance: Patient tolerated treatment well;Patient limited by pain Patient left: in chair;with call bell/phone within reach;with family/visitor present  OT Visit Diagnosis: Unsteadiness on feet (R26.81);Pain  Time: 1610-9604 OT Time Calculation (min): 30 min Charges:  OT General Charges $OT Visit: 1 Procedure OT Evaluation $OT Eval Moderate Complexity: 1 Procedure OT Treatments $Self Care/Home Management : 8-22 mins G-Codes: OT G-codes **NOT FOR INPATIENT CLASS** Functional Assessment Tool Used: AM-PAC 6 Clicks Daily Activity Functional Limitation:  Self care Self Care Current Status (V4098): At least 40 percent but less than 60 percent impaired, limited or restricted Self Care Goal Status (J1914): At least 1 percent but less than 20 percent impaired, limited or restricted   Fredric Mare A. Brett Albino, M.S., OTR/L Pager: [phone]  Gaye Alken 08/18/2016, 11:49 AM

## 2016-08-18 NOTE — Discharge Summary (Signed)
Name: Katherine Luna MRN: [id number] DOB: [birth year] 61 y.o. PCP: Deloris Pingyter-Brown, Sherry M, MD  Date of Admission: 08/16/2016 11:43 AM Date of Discharge: 08/18/2016 Attending Physician: No att. providers found  Discharge Diagnosis: Syncopal episode TBI with concussive symptoms Right pubic bone fracture L3 compression fracture  Right ribs 4-6 fracture  Discharge Medications: Allergies as of 08/18/2016      Reactions   Codeine Nausea And Vomiting, Other (See Comments)   REACTION: GI Upset REACTION: GI Upset   Eggs Or Egg-derived Products Other (See Comments)   Sour stomach   Morphine And Related Other (See Comments)   "feels like bugs are crawling all over body"   Penicillins Rash   REACTION: Rash (x1 time only), has taken Amox with no problem Has patient had a PCN reaction causing immediate rash, facial/tongue/throat swelling, SOB or lightheadedness with hypotension: Yes Has patient had a PCN reaction causing severe rash involving mucus membranes or skin necrosis: Yes Has patient had a PCN reaction that required hospitalization: No Has patient had a PCN reaction occurring within the last 10 years: No If all of the above answers are "NO", then may proceed with Cephalosporin us      Medication List    STOP taking these medications   azithromycin 250 MG tablet Commonly known as:  ZITHROMAX Z-PAK   ondansetron 4 MG disintegrating tablet Commonly known as:  ZOFRAN ODT     TAKE these medications   aspirin 81 MG tablet Take 81 mg by mouth daily.   citalopram 20 MG tablet Commonly known as:  CELEXA Take 20 mg by mouth at bedtime.   hydrochlorothiazide 12.5 MG capsule Commonly known as:  MICROZIDE Take 12.5 mg by mouth daily.   ibuprofen 800 MG tablet Commonly known as:  ADVIL,MOTRIN Take 1 tablet (800 mg total) by mouth every 8 (eight) hours as needed.   JANUMET XR 50-1000 MG Tb24 Generic drug:  SitaGLIPtin-MetFORMIN HCl Take 1 tablet by mouth 2 (two) times daily.     lisinopril 30 MG tablet Commonly known as:  PRINIVIL,ZESTRIL Take 30 mg by mouth daily.   Oxycodone HCl 10 MG Tabs Take 1 tablet (10 mg total) by mouth every 6 (six) hours as needed for severe pain.   QSYMIA 7.5-46 MG Cp24 Generic drug:  Phentermine-Topiramate Take 1 tablet by mouth every other day.   simvastatin 40 MG tablet Commonly known as:  ZOCOR Take 40 mg by mouth every evening.   vitamin B-12 500 MCG tablet Commonly known as:  CYANOCOBALAMIN Take 500 mcg by mouth daily.   vitamin C 100 MG tablet Take 100 mg by mouth daily.      Disposition and follow-up:   [facility].  At the hospital follow up visit please address:  1.  Patient had multiple, non-operable orthopedic injuries including a R hip, R rib (4th and 6th), and L3 vertebral fracture. Patient will need to follow up with orthopedic surgery and neurosurgery as an outpatient to assess patient's progression over time. Orders were placed for home health PT and OT, as the patient will likely require assistance with recovery from these injuries.   2.  Labs / imaging needed at time of follow-up: None  3.  Pending labs/ test needing follow-up: None  Follow-up Appointments: Follow-up Information    Katherine Luna, Katherine Patrick, MD. Schedule an appointment as soon as possible for a visit on 09/05/2016.   Specialty:  Orthopedic Surgery Why:  At 10:00 AM. Contact information: [address] [place] [address] [phone]        [name], Vincent J, [place]C. Schedule an appointment as soon as possible for a visit on 08/24/2016.   Specialty:  Physician Assistant Why:  AT 1:45 PM. Contact information: [address] [place] [address] [phone]           Hospital Course by problem list:  1. Syncopal episode 2/2 TBI with concussion: The patient had a syncopal episode in the ED while walking with her nurse and she was admitted  for further workup. She denied personal and family history of arrythmias. Overnight telemetry monitoring did not reveal any abnormalities. The syncopal episode was likely a vasovagal event 2/2 pain with ambulation from her resulting fractures. The patient experienced some dizziness with standing and diaphoresis with walking during PT evaluation, but did not have another syncopal event during hospitalization. The patient continued to complain of headache and endorse memory loss of fall from horse, but pain was well managed with oral medications.   2. Fall from horse with L3, right rib, and right pubic bone fractures: The patient was evaluated by neurosurgery and orthopedic surgery for her injuries, which were determined to be inoperable. The patient was placed in a TLO brace, which she tolerated well during hospitalization. She was evaluated by PT and OT. The patient stated she did not need PT at her home, but PT recommended home health care for her during evaluation. OT also recommended home health care for her injuries. The patient was encouraged to work with PT/OT at home to help with recovery. Her pain was managed well with scheduled Toradol and PRN oxycodone for breakthrough pain. She was sent home with a prescription for ibuprofen 800 mg TID and oxycodone 10 mg QID PRN for 5 days. She will follow up with neurosurgery and orthopedic surgery as planned.   Discharge Vitals:   BP 126/77 (BP Location: Right Arm)   Pulse 65   Temp 98.1 F (36.7 C) (Oral)   Resp 18   Ht 5\' 4"  (1.626 m)   Wt 160 lb 6.4 oz (72.8 kg)   SpO2 99%   BMI 27.53 kg/m   Pertinent Labs, Studies, and Procedures:   IMPRESSION: X Ray Hips, bilateral There is subtle contour angulation deformity of the anterior cortex of the L3 vertebral body, suggesting mild compression fracture. No significant loss of height. No retropulsion is evident. Posterior elements appear intact. Alignment is preserved. No other acute findings.  Interspaces preserved throughout. Early anterior endplate spurring [id number]-S1. No other significant osseous degenerative change. Bilateral pelvic phleboliths.  IMPRESSION: X Ray Lumbar spine complete Minimally comminuted minimally displaced fracture of the right pubic bone, involving the superior and inferior pubic rami. No other pelvic ring fracture. Bilateral hips unremarkable. Bilateral pelvic phleboliths incidentally noted.  IMPRESSION: X Ray Ribs, unilateral Right Acute minimally displaced fractures involving the right 4th and 6th ribs with associated pleural effusion, pneumothorax or radiopaque foreign body.  Discharge Instructions: Discharge Instructions    Call MD for:  difficulty breathing, headache or visual disturbances    Complete by:  As directed    Call MD for:  persistant dizziness or light-headedness    Complete by:  As directed    Call MD for:  persistant nausea and vomiting    Complete by:  As directed    Call MD for:  redness, tenderness, or signs of infection (pain, swelling, redness, odor or green/yellow discharge around incision site)  Complete by:  As directed    Call MD for:  severe uncontrolled pain    Complete by:  As directed    Call MD for:  temperature >100.4    Complete by:  As directed    Diet general    Complete by:  As directed    Discharge instructions    Complete by:  As directed    The patient will be discharged to home. She will require follow up for her multiple, orthopedic injuries. She will have to follow up as an outpatient with both neurosurgery and orthopedic surgery. She was referred for home health PT and OT evaluation on discharged and is strongly recommended to continue working with them for her injuries. She will be discharged with instructions to take scheduled ibuprofen 800 mg, 3 times a day and oxycodone 10 mg, 1 tablet every 6 hours for 5 days as needed for pain management.   Increase activity slowly    Complete by:  As directed         Signed: Rozann Lesches, MD 08/18/2016, 5:32 PM   Pager: [phone]

## 2016-09-05 ENCOUNTER — Ambulatory Visit (INDEPENDENT_AMBULATORY_CARE_PROVIDER_SITE_OTHER): Payer: BC Managed Care – PPO | Admitting: Physical Therapy

## 2016-09-05 DIAGNOSIS — M545 Low back pain, unspecified: Secondary | ICD-10-CM

## 2016-09-05 DIAGNOSIS — M6281 Muscle weakness (generalized): Secondary | ICD-10-CM

## 2016-09-05 DIAGNOSIS — R2689 Other abnormalities of gait and mobility: Secondary | ICD-10-CM

## 2016-09-05 DIAGNOSIS — M25551 Pain in right hip: Secondary | ICD-10-CM

## 2016-09-05 NOTE — Therapy (Signed)
[facility] [place] [address] [place], [place], [address] Phone: [phone]   Fax:  [phone]  Physical Therapy Evaluation  Patient Details  Name: Katherine Luna MRN: [id number] Date of Birth: September 23, 1955 Referring Provider: Dr. Duwayne Heck  Encounter Date: 09/05/2016      PT End of Session - 09/05/16 1444    Visit Number 1   Number of Visits 16   Date for PT Re-Evaluation 10/31/16   Authorization Type BCBS   PT Start Time 1405   PT Stop Time 1441   PT Time Calculation (min) 36 min   Equipment Utilized During Treatment Back brace   Activity Tolerance Patient tolerated treatment well   Behavior During Therapy [facility] for tasks assessed/performed      Past Medical History:  Diagnosis Date  . Diabetes mellitus without complication (HCC)   . Hyperlipemia   . Hypertension     Past Surgical History:  Procedure Laterality Date  . ABDOMINAL HYSTERECTOMY    . ABDOMINOPLASTY    . CESAREAN SECTION    . KNEE SURGERY      There were no vitals filed for this visit.       Subjective Assessment - 09/05/16 1408    Subjective Pt is a 61 y/o female who presents to OPPT after falling off horse on 08/15/16.  Pt with + LOC, Rt pubis fx, Rt rib fx 4-6 fx.  Pt presents today with continued pain, difficulty walking and decreased mobilty.    Pertinent History HTN, DM, HLD   Limitations Sitting;Walking;Standing   How long can you sit comfortably? "a couple hours"   How long can you walk comfortably? unable to walk without UE support   Patient Stated Goals return to riding horses   Currently in Pain? Yes   Pain Score 0-No pain  7/10 with standing/walking   Pain Location Pelvis   Pain Orientation Right   Pain Descriptors / Indicators Sharp   Pain Type Acute pain   Pain Onset 1 to 4 weeks ago   Pain Frequency Intermittent   Aggravating Factors  walking, standing   Pain Relieving Factors sitting, ibuprofen            [facility] PT  Assessment - 09/05/16 1415      Assessment   Medical Diagnosis fall off horse; Rt pubis fx, L3 compression fx, rib fxs   Referring Provider Dr. Duwayne Heck   Onset Date/Surgical Date 08/15/16   Next MD Visit 10/03/16   Prior Therapy in acute only     Precautions   Precautions Back   Required Braces or Orthoses Spinal Brace   Spinal Brace Thoracolumbosacral orthotic;Applied in sitting position     Restrictions   Weight Bearing Restrictions No     Balance Screen   Has the patient fallen in the past 6 months Yes   How many times? 1-fall off horse   Has the patient had a decrease in activity level because of a fear of falling?  No   Is the patient reluctant to leave their home because of a fear of falling?  No     Home Environment   Living Environment Private residence   Living Arrangements Spouse/significant other   Available Help at Discharge Available 24 hours/day;Family  husband on FMLA   Type of Home House   Home Access Stairs to enter   Entrance Stairs-Number of Steps 3   Entrance Stairs-Rails None  reports she holds onto husband   Home Layout  One level   Magazine features editor - 4 wheels;Tub bench     Prior Function   Level of Independence Independent  currently needs assistance with ADLs   Vocation Retired   [employer] retired Runner, broadcasting/film/video   Leisure ride horses, travel, swimming     Cognition   Overall Cognitive Status Within Functional Limits for tasks assessed     Observation/Other Assessments   Focus on Therapeutic Outcomes (FOTO)  24 (76% limited; predicted 48% limited)     ROM / Strength   AROM / PROM / Strength Strength     Strength   Overall Strength Comments tested in sitting due to TLSO and decreased mobility; able to stand without UE support with increased difficulty and reliance on LLE   Strength Assessment Site Hip;Knee;Ankle   Right/Left Hip Left;Right   Right Hip Flexion 3+/5  with pain   Right Hip External Rotation  3/5  with pain    Right Hip Internal Rotation 5/5   Right Hip ABduction 3/5  with pain   Right Hip ADduction 4/5   Left Hip Flexion 5/5   Left Hip External Rotation 4/5   Left Hip Internal Rotation 5/5   Left Hip ABduction 5/5   Left Hip ADduction 5/5   Right/Left Knee Right;Left   Right Knee Flexion 5/5   Right Knee Extension 5/5   Left Knee Flexion 5/5   Left Knee Extension 5/5   Right/Left Ankle Right;Left   Right Ankle Dorsiflexion 5/5   Right Ankle Plantar Flexion --  not formally tested but visible atrophy noted in Rt gastroc   Left Ankle Dorsiflexion 5/5     Transfers   Transfers Sit to Stand;Stand to Sit   Sit to Stand 5: Supervision;Without upper extremity assist   Sit to Stand Details (indicate cue type and reason) Lt lateral lean   Stand to Sit 5: Supervision;Without upper extremity assist     Ambulation/Gait   Ambulation/Gait Yes   Ambulation/Gait Assistance 6: Modified independent (Device/Increase time)   Ambulation Distance (Feet) 100 Feet   Assistive device 4-wheeled walker   Gait Pattern Decreased stance time - right;Decreased step length - left;Lateral hip instability;Trendelenburg;Antalgic            Objective measurements completed on examination: See above findings.                  PT Education - 09/05/16 1444    Education provided Yes   Education Details HEP   Person(s) Educated Patient   Methods Explanation;Demonstration;Handout   Comprehension Verbalized understanding;Returned demonstration;Need further instruction          PT Short Term Goals - 09/05/16 1547      PT SHORT TERM GOAL #1   Title independent with initial HEP   Time 4   Period Weeks   Status New   Target Date 10/03/16     PT SHORT TERM GOAL #2   Title amb > 200' with SPC with supervision for improved functional mobiltiy   Time 4   Period Weeks   Status New   Target Date 10/03/16           PT Long Term Goals - 09/05/16 1548      PT LONG TERM GOAL #1   Title  independent with advanced HEP   Time 8   Period Weeks   Status New   Target Date 10/31/16     PT LONG TERM GOAL #2   Title amb > 500' on various  indoor/outdoor surfaces independently without device for improved mobility and balance   Time 8   Period Weeks   Status New   Target Date 10/31/16     PT LONG TERM GOAL #3   Title negotiate at least 4 stairs without device or rail independently for improved mobility   Time 8   Period Weeks   Status New   Target Date 10/31/16     PT LONG TERM GOAL #4   Title report pain < 3/10 with activity for improved pain   Time 8   Period Weeks   Target Date 10/31/16     PT LONG TERM GOAL #5   Title demonstrate ability to step up at least 18" with 1 UE support in preparation for returning to riding horses and in order to safely mount a horse   Time 8   Period Weeks   Status New   Target Date 10/31/16                Plan - 09/05/16 1542    Clinical Impression Statement Pt is a 61 y/o female who presents to OPPT s/p fall off horse resulting in +LOC, Rt 4-6 rib fx, L3 compression fx and Rt pubis fx.  Pt presents today still in TLSO and using rollator for ambulation.  Strength testing only performed in sitting due to multiple injuries, but demonstrates weakness throughout RLE, with visible atrophy of muscles.  Will benefit from PT to address deficits listed below.     History and Personal Factors relevant to plan of care: multiple fxs, concussion; significant change in functional status   Clinical Presentation Evolving   Clinical Presentation due to: multiple fxs, still needing to wear TLSO for mobilty   Clinical Decision Making Moderate   Rehab Potential Good   Clinical Impairments Affecting Rehab Potential severity of deficits   PT Frequency 2x / week   PT Duration 8 weeks   PT Treatment/Interventions ADLs/Self Care Home Management;Canalith Repostioning;Cryotherapy;Electrical Stimulation;Moist Heat;Ultrasound;Neuromuscular  re-education;Balance training;Therapeutic exercise;Therapeutic activities;Functional mobility training;Stair training;DME Instruction;Gait training;Patient/family education;Manual techniques;Passive range of motion;Vestibular;Taping;Dry needling   PT Next Visit Plan review HEP, try gait with cane(s), work on weight shifting and increasing RLE weight bearing to tolerance; strengthening exercises   Consulted and Agree with Plan of Care Patient      Patient will benefit from skilled therapeutic intervention in order to improve the following deficits and impairments:  Abnormal gait, Pain, Decreased strength, Decreased mobility, Difficulty walking, Decreased balance, Decreased knowledge of use of DME  Visit Diagnosis: Acute right-sided low back pain without sciatica - Plan: PT plan of care cert/re-cert  Pain in right hip - Plan: PT plan of care cert/re-cert  Muscle weakness (generalized) - Plan: PT plan of care cert/re-cert  Other abnormalities of gait and mobility - Plan: PT plan of care cert/re-cert     Problem List Patient Active Problem List   Diagnosis Date Noted  . Syncope 08/16/2016  . ACUTE SINUSITIS, UNSPECIFIED 06/10/2009  . VERTIGO 06/10/2009  . SLEEP APNEA, CHRONIC 06/10/2009  . NAUSEA ALONE 06/10/2009  . BRONCHITIS 04/02/2009  . DIABETES MELLITUS, TYPE I 03/22/2009  . HYPERLIPIDEMIA 03/22/2009  . HYPERTENSION 03/22/2009  . NECK PAIN, ACUTE 03/22/2009  . BACK PAIN, THORACIC REGION, LEFT 03/22/2009      Clarita Crane, PT, DPT 09/05/16 3:53 PM    [facility] [address] [place] [address] [place], [place], [address] Phone: [phone]   Fax:  [phone]  Name: SANDRALEE TARKINGTON  MRN: [id number] Date of Birth: [birth year]

## 2016-09-05 NOTE — Patient Instructions (Signed)
Functional Quadriceps: Sit to Stand    Sit on edge of chair, feet flat on floor. Stand upright, extending knees fully. Repeat __10__ times per set. Do __1__ sets per session. Do __2-3__ sessions per day.   Standing Marching   Using a chair if necessary, march in place. Repeat _10___ times. Do __2-3__ sessions per day.  http://gt2.exer.us/344   Copyright  VHI. All rights reserved.   Hip Backward Kick   Using a chair for balance, keep legs shoulder width apart and toes pointed for- ward. Slowly extend one leg back, keeping knee straight. Do not lean forward. Repeat with other leg. Repeat _10___ times. Do _2-3__ sessions per day.  http://gt2.exer.us/340   Copyright  VHI. All rights reserved.   Hip Side Kick   Holding a chair for balance, keep legs shoulder width apart and toes pointed forward. Swing a leg out to side, keeping knee straight. Do not lean. Repeat using other leg. Repeat __10__ times. Do _2-3___ sessions per day.   Heel Raises    Stand with support. With knees straight, raise heels off ground. Hold _1-2__ seconds. Relax for _1-2__ seconds. Repeat __10_ times. Do __2-3_ times a day.  Copyright  VHI. All rights reserved.

## 2016-09-12 ENCOUNTER — Ambulatory Visit (INDEPENDENT_AMBULATORY_CARE_PROVIDER_SITE_OTHER): Payer: BC Managed Care – PPO | Admitting: Physical Therapy

## 2016-09-12 DIAGNOSIS — M545 Low back pain, unspecified: Secondary | ICD-10-CM

## 2016-09-12 DIAGNOSIS — M6281 Muscle weakness (generalized): Secondary | ICD-10-CM | POA: Diagnosis not present

## 2016-09-12 DIAGNOSIS — M25551 Pain in right hip: Secondary | ICD-10-CM | POA: Diagnosis not present

## 2016-09-12 DIAGNOSIS — R2689 Other abnormalities of gait and mobility: Secondary | ICD-10-CM | POA: Diagnosis not present

## 2016-09-12 NOTE — Therapy (Signed)
[facility] Out[facility] [place] [address] [place], [place], [address] Phone: [phone]   Fax:  [phone]  Physical Therapy Treatment  Patient Details  Name: Katherine Luna MRN: [id number] Date of Birth: Sep 18, 1955 Referring Provider: Dr. Duwayne Heck  Encounter Date: 09/12/2016      PT End of Session - 09/12/16 1053    Visit Number 2   Number of Visits 16   Date for PT Re-Evaluation 10/31/16   Authorization Type BCBS   PT Start Time 0932   PT Stop Time 1014   PT Time Calculation (min) 42 min   Equipment Utilized During Treatment Back brace   Activity Tolerance Patient tolerated treatment well   Behavior During Therapy [facility] for tasks assessed/performed      Past Medical History:  Diagnosis Date  . Diabetes mellitus without complication (HCC)   . Hyperlipemia   . Hypertension     Past Surgical History:  Procedure Laterality Date  . ABDOMINAL HYSTERECTOMY    . ABDOMINOPLASTY    . CESAREAN SECTION    . KNEE SURGERY      There were no vitals filed for this visit.      Subjective Assessment - 09/12/16 0933    Subjective arrives today without rollator; using cane (incorrectly)   Pertinent History HTN, DM, HLD   Patient Stated Goals return to riding horses   Currently in Pain? No/denies                         [facility] Adult PT Treatment/Exercise - 09/12/16 0936      Ambulation/Gait   Ambulation/Gait Yes   Ambulation/Gait Assistance 5: Supervision   Ambulation/Gait Assistance Details cues for proper sequencing   Ambulation Distance (Feet) 150 Feet   Assistive device Straight cane   Gait Pattern Decreased stance time - right;Decreased step length - left;Lateral hip instability;Trendelenburg;Antalgic     Exercises   Exercises Knee/Hip     Knee/Hip Exercises: Aerobic   Nustep L5 x 6 min     Knee/Hip Exercises: Standing   Knee Flexion Both;10 reps   Knee Flexion Limitations 3#   Hip Flexion Both;10  reps;Knee bent   Hip Flexion Limitations 3#   Hip Abduction Both;10 reps;Knee straight   Abduction Limitations 3#   Hip Extension Both;10 reps;Knee straight   Extension Limitations 3#   Other Standing Knee Exercises weight shifting ant/post and laterally without UE support     Knee/Hip Exercises: Seated   Long Arc Quad Both;10 reps;Weights   Long Arc Quad Weight 3 lbs.   Long [employer] Limitations with ball squeeze   [employer] 10 reps x 5 sec   Marching Both;10 reps;Weights   Marching Weights 3 lbs.   Hamstring Curl Both;10 reps   Hamstring Limitations green theraband   Sit to Sand 10 reps;without UE support                  PT Short Term Goals - 09/05/16 1547      PT SHORT TERM GOAL #1   Title independent with initial HEP   Time 4   Period Weeks   Status New   Target Date 10/03/16     PT SHORT TERM GOAL #2   Title amb > 200' with SPC with supervision for improved functional mobiltiy   Time 4   Period Weeks   Status New   Target Date 10/03/16  PT Long Term Goals - 09/05/16 1548      PT LONG TERM GOAL #1   Title independent with advanced HEP   Time 8   Period Weeks   Status New   Target Date 10/31/16     PT LONG TERM GOAL #2   Title amb > 500' on various indoor/outdoor surfaces independently without device for improved mobility and balance   Time 8   Period Weeks   Status New   Target Date 10/31/16     PT LONG TERM GOAL #3   Title negotiate at least 4 stairs without device or rail independently for improved mobility   Time 8   Period Weeks   Status New   Target Date 10/31/16     PT LONG TERM GOAL #4   Title report pain < 3/10 with activity for improved pain   Time 8   Period Weeks   Target Date 10/31/16     PT LONG TERM GOAL #5   Title demonstrate ability to step up at least 18" with 1 UE support in preparation for returning to riding horses and in order to safely mount a horse   Time 8   Period Weeks   Status New    Target Date 10/31/16               Plan - 09/12/16 1053    Clinical Impression Statement Pt tolerated session well today and is trying to wean from rollator.  Arrived today with cane too high (wooden and unable to adjust in clinic) and using incorrectly.  Used trial cane in clinic adjusted to appropriate height and instructed in proper technique with pt demonstrating good return.  Marked wooden cane so pt can shorten at home.  Will continue to benefit from PT to maximize function.   PT Treatment/Interventions ADLs/Self Care Home Management;Canalith Repostioning;Cryotherapy;Electrical Stimulation;Moist Heat;Ultrasound;Neuromuscular re-education;Balance training;Therapeutic exercise;Therapeutic activities;Functional mobility training;Stair training;DME Instruction;Gait training;Patient/family education;Manual techniques;Passive range of motion;Vestibular;Taping;Dry needling   PT Next Visit Plan cont gait with cane(s), work on weight shifting and increasing RLE weight bearing to tolerance; strengthening exercises   Consulted and Agree with Plan of Care Patient      Patient will benefit from skilled therapeutic intervention in order to improve the following deficits and impairments:  Abnormal gait, Pain, Decreased strength, Decreased mobility, Difficulty walking, Decreased balance, Decreased knowledge of use of DME  Visit Diagnosis: Acute right-sided low back pain without sciatica  Pain in right hip  Muscle weakness (generalized)  Other abnormalities of gait and mobility     Problem List Patient Active Problem List   Diagnosis Date Noted  . Syncope 08/16/2016  . ACUTE SINUSITIS, UNSPECIFIED 06/10/2009  . VERTIGO 06/10/2009  . SLEEP APNEA, CHRONIC 06/10/2009  . NAUSEA ALONE 06/10/2009  . BRONCHITIS 04/02/2009  . DIABETES MELLITUS, TYPE I 03/22/2009  . HYPERLIPIDEMIA 03/22/2009  . HYPERTENSION 03/22/2009  . NECK PAIN, ACUTE 03/22/2009  . BACK PAIN, THORACIC REGION, LEFT  03/22/2009      Clarita Crane, PT, DPT 09/12/16 10:56 AM    [facility] [address] [place] [address] [place], [place], [address] Phone: [phone]   Fax:  [phone]  Name: Katherine Luna MRN: [id number] Date of Birth: 11-30-1955

## 2016-09-15 ENCOUNTER — Ambulatory Visit (INDEPENDENT_AMBULATORY_CARE_PROVIDER_SITE_OTHER): Payer: BC Managed Care – PPO | Admitting: Physical Therapy

## 2016-09-15 DIAGNOSIS — M6281 Muscle weakness (generalized): Secondary | ICD-10-CM | POA: Diagnosis not present

## 2016-09-15 DIAGNOSIS — M545 Low back pain, unspecified: Secondary | ICD-10-CM

## 2016-09-15 DIAGNOSIS — R2689 Other abnormalities of gait and mobility: Secondary | ICD-10-CM

## 2016-09-15 DIAGNOSIS — M25551 Pain in right hip: Secondary | ICD-10-CM

## 2016-09-15 NOTE — Therapy (Signed)
[facility] [place] [address] [place] [address] [place], [place], [address] Phone: [phone]   Fax:  [phone]  Physical Therapy Treatment  Patient Details  Name: Katherine Luna MRN: [id number] Date of Birth: Nov 24, 1955 Referring Provider: Dr. Duwayne Heck  Encounter Date: 09/15/2016      PT End of Session - 09/15/16 1009    Visit Number 3   Number of Visits 16   Date for PT Re-Evaluation 10/31/16   Authorization Type BCBS   PT Start Time 0932   PT Stop Time 0959  pt had to leave early   PT Time Calculation (min) 27 min   Equipment Utilized During Treatment Back brace   Activity Tolerance Patient tolerated treatment well   Behavior During Therapy [facility] for tasks assessed/performed      Past Medical History:  Diagnosis Date  . Diabetes mellitus without complication (HCC)   . Hyperlipemia   . Hypertension     Past Surgical History:  Procedure Laterality Date  . ABDOMINAL HYSTERECTOMY    . ABDOMINOPLASTY    . CESAREAN SECTION    . KNEE SURGERY      There were no vitals filed for this visit.      Subjective Assessment - 09/15/16 0936    Subjective shortened cane and it's much better; doing exercises at home and everything is going well.  no longer using rollator at all.   Pertinent History HTN, DM, HLD   Patient Stated Goals return to riding horses   Currently in Pain? No/denies                         [facility] Adult PT Treatment/Exercise - 09/15/16 0936      Knee/Hip Exercises: Aerobic   Nustep L5 x 6 min     Knee/Hip Exercises: Machines for Strengthening   Cybex Leg Press 4 plates [id number]     Knee/Hip Exercises: Standing   Heel Raises Both;20 reps;3 seconds   Forward Step Up Both;10 reps;Hand Hold: 0;Step Height: 4"   Functional Squat 10 reps   SLS 3x30 sec bil with intermittent UE support   Gait Training side stepping; tandem gait; marching and backwards walking 20' x 4 each                  PT Short Term Goals - 09/05/16 1547      PT SHORT TERM GOAL #1   Title independent with initial HEP   Time 4   Period Weeks   Status New   Target Date 10/03/16     PT SHORT TERM GOAL #2   Title amb > 200' with SPC with supervision for improved functional mobiltiy   Time 4   Period Weeks   Status New   Target Date 10/03/16           PT Long Term Goals - 09/05/16 1548      PT LONG TERM GOAL #1   Title independent with advanced HEP   Time 8   Period Weeks   Status New   Target Date 10/31/16     PT LONG TERM GOAL #2   Title amb > 500' on various indoor/outdoor surfaces independently without device for improved mobility and balance   Time 8   Period Weeks   Status New   Target Date 10/31/16     PT LONG TERM GOAL #3   Title negotiate at least 4 stairs without device or rail independently for  improved mobility   Time 8   Period Weeks   Status New   Target Date 10/31/16     PT LONG TERM GOAL #4   Title report pain < 3/10 with activity for improved pain   Time 8   Period Weeks   Target Date 10/31/16     PT LONG TERM GOAL #5   Title demonstrate ability to step up at least 18" with 1 UE support in preparation for returning to riding horses and in order to safely mount a horse   Time 8   Period Weeks   Status New   Target Date 10/31/16               Plan - 09/15/16 1009    Clinical Impression Statement Pt tolerated session well today with min c/o increased pain in weightbearing with balance activities but pt reporting within tolerance.  No cane utilized today during session.  Progressing well with PT.   PT Treatment/Interventions ADLs/Self Care Home Management;Canalith Repostioning;Cryotherapy;Electrical Stimulation;Moist Heat;Ultrasound;Neuromuscular re-education;Balance training;Therapeutic exercise;Therapeutic activities;Functional mobility training;Stair training;DME Instruction;Gait training;Patient/family education;Manual techniques;Passive range of  motion;Vestibular;Taping;Dry needling   PT Next Visit Plan cont gait with cane(s), work on weight shifting and increasing RLE weight bearing to tolerance; strengthening exercises   Consulted and Agree with Plan of Care Patient      Patient will benefit from skilled therapeutic intervention in order to improve the following deficits and impairments:  Abnormal gait, Pain, Decreased strength, Decreased mobility, Difficulty walking, Decreased balance, Decreased knowledge of use of DME  Visit Diagnosis: Acute right-sided low back pain without sciatica  Pain in right hip  Muscle weakness (generalized)  Other abnormalities of gait and mobility     Problem List Patient Active Problem List   Diagnosis Date Noted  . Syncope 08/16/2016  . ACUTE SINUSITIS, UNSPECIFIED 06/10/2009  . VERTIGO 06/10/2009  . SLEEP APNEA, CHRONIC 06/10/2009  . NAUSEA ALONE 06/10/2009  . BRONCHITIS 04/02/2009  . DIABETES MELLITUS, TYPE I 03/22/2009  . HYPERLIPIDEMIA 03/22/2009  . HYPERTENSION 03/22/2009  . NECK PAIN, ACUTE 03/22/2009  . BACK PAIN, THORACIC REGION, LEFT 03/22/2009      [name], PT, DPT 09/15/16 10:11 AM    [facility] [address] [place] [address] [place], [place], [address] Phone: [phone]   Fax:  [phone]  Name: Katherine Luna MRN: [id number] Date of Birth: 19-Mar-1955

## 2016-09-20 ENCOUNTER — Ambulatory Visit (INDEPENDENT_AMBULATORY_CARE_PROVIDER_SITE_OTHER): Payer: BC Managed Care – PPO | Admitting: Physical Therapy

## 2016-09-20 DIAGNOSIS — M25551 Pain in right hip: Secondary | ICD-10-CM | POA: Diagnosis not present

## 2016-09-20 DIAGNOSIS — M6281 Muscle weakness (generalized): Secondary | ICD-10-CM | POA: Diagnosis not present

## 2016-09-20 DIAGNOSIS — M545 Low back pain, unspecified: Secondary | ICD-10-CM

## 2016-09-20 DIAGNOSIS — R2689 Other abnormalities of gait and mobility: Secondary | ICD-10-CM | POA: Diagnosis not present

## 2016-09-20 NOTE — Therapy (Signed)
[place], [place], [address] Phone: [phone]   Fax:  [phone]  Physical Therapy Treatment  Patient Details  Name: Katherine Luna MRN: [id number] Date of Birth: 1955/02/04 Referring Provider: Dr. Victorino December  Encounter Date: 09/20/2016      PT End of Session - 09/20/16 0852    Visit Number 4   Number of Visits 16   Date for PT Re-Evaluation 10/31/16   Authorization Type BCBS   PT Start Time 0850   PT Stop Time 0929   PT Time Calculation (min) 39 min   Activity Tolerance Patient tolerated treatment well   Behavior During Therapy [facility] for tasks assessed/performed      Past Medical History:  Diagnosis Date  . Diabetes mellitus without complication ([place])   . Hyperlipemia   . Hypertension     Past Surgical History:  Procedure Laterality Date  . ABDOMINAL HYSTERECTOMY    . ABDOMINOPLASTY    . CESAREAN SECTION    . KNEE SURGERY      There were no vitals filed for this visit.      Subjective Assessment - 09/20/16 0852    Subjective Pt reports she saw neurosurgeon.  He said she has to wear the brace until 10/11; can take it off if just sitting around house.   Xrays looked good, healing well.  She can tell she has become dependent on brace, her back gets tired quickly when not in brace.  She is starting to not use cane in house.    Patient Stated Goals return to riding horses   Currently in Pain? No/denies   Pain Score --  just stiffness.             [facility] PT Assessment - 09/20/16 0001      Assessment   Medical Diagnosis fall off horse; Rt pubis fx, L3 compression fx, rib fxs   Referring Provider Dr. Victorino December   Onset Date/Surgical Date 08/15/16   Next MD Visit 10/03/16   Prior Therapy in acute only     Precautions   Precautions Back   Required Braces or Orthoses Spinal Brace   Spinal Brace Thoracolumbosacral orthotic;Applied in sitting position         [facility] Adult  PT Treatment/Exercise - 09/20/16 0001      Knee/Hip Exercises: Stretches   Passive Hamstring Stretch Right;Left;2 reps;30 seconds  supine with strap   Passive Hamstring Stretch Limitations (also shown seated with straight back)   Gastroc Stretch Right;Left;20 seconds;2 reps  heels hanging off step     Knee/Hip Exercises: Aerobic   Nustep L4 x 6 min, arms/legs   Other Aerobic 3 laps around gym, without AD (80 ft laps)     Knee/Hip Exercises: Standing   Heel Raises Both;1 set;10 reps   Hip Abduction Stengthening;Right;Left;1 set;10 reps;Knee straight  red band at ankle   Hip Extension Right;Left;1 set;10 reps;Knee straight  red band at ankle   Lateral Step Up Right;1 set;10 reps;Hand Hold: 1;Step Height: 4"   Forward Step Up Right;2 sets;Hand Hold: 1  3" step, VC for slow controlled movement   SLS 2 x 30 sec bil with intermittent UE support, 2nd rep on Lt  with blue pad     Knee/Hip Exercises: Seated   Sit to Sand without UE support;15 reps  mirror for visual feedback, VC to slow speed                PT  Education - 09/20/16 0931    Education provided Yes   Education Details issued red band for standing hip ex's.  added hamstring stretch in supine, pt declined handout.    Person(s) Educated Patient   Methods Explanation;Demonstration   Comprehension Returned demonstration;Verbalized understanding          PT Short Term Goals - 09/20/16 0858      PT SHORT TERM GOAL #1   Title independent with initial HEP   Time 4   Period Weeks   Status On-going     PT SHORT TERM GOAL #2   Title amb > 200' with SPC with supervision for improved functional mobiltiy   Time 4   Period Weeks   Status Achieved           PT Long Term Goals - 09/20/16 0857      PT LONG TERM GOAL #1   Title independent with advanced HEP   Time 8   Period Weeks   Status On-going     PT LONG TERM GOAL #2   Title amb > 500' on various indoor/outdoor surfaces independently without device  for improved mobility and balance   Time 8   Period Weeks   Status On-going     PT LONG TERM GOAL #3   Title negotiate at least 4 stairs without device or rail independently for improved mobility   Time 8   Period Weeks   Status On-going     PT LONG TERM GOAL #4   Title report pain < 3/10 with activity for improved pain   Time 8   Period Weeks   Status On-going     PT LONG TERM GOAL #5   Title demonstrate ability to step up at least 18" with 1 UE support in preparation for returning to riding horses and in order to safely mount a horse   Time 8   Period Weeks   Status On-going               Plan - 09/20/16 4098    Clinical Impression Statement Pt demonstrated improved gait; able to ambulate 240 ft without AD without difficulty and minimal gait deviations.  Pt reported pain up to 5/10 with Rt SLS with Lt hip abduction; reduced with rest.   Pt required multiple cues to slow speed of exercise.  She has met STG#2 and is making good gains towards remaining goals.    Rehab Potential Good   PT Frequency 2x / week   PT Duration 8 weeks   PT Treatment/Interventions ADLs/Self Care Home Management;Canalith Repostioning;Cryotherapy;Electrical Stimulation;Moist Heat;Ultrasound;Neuromuscular re-education;Balance training;Therapeutic exercise;Therapeutic activities;Functional mobility training;Stair training;DME Instruction;Gait training;Patient/family education;Manual techniques;Passive range of motion;Vestibular;Taping;Dry needling   PT Next Visit Plan cont progressive LE strengthing, increasing RLE weight bearing to tolerance. Progress HEP.    Consulted and Agree with Plan of Care Patient      Patient will benefit from skilled therapeutic intervention in order to improve the following deficits and impairments:  Abnormal gait, Pain, Decreased strength, Decreased mobility, Difficulty walking, Decreased balance, Decreased knowledge of use of DME  Visit Diagnosis: Acute right-sided low  back pain without sciatica  Pain in right hip  Muscle weakness (generalized)  Other abnormalities of gait and mobility     Problem List Patient Active Problem List   Diagnosis Date Noted  . Syncope 08/16/2016  . ACUTE SINUSITIS, UNSPECIFIED 06/10/2009  . VERTIGO 06/10/2009  . SLEEP APNEA, CHRONIC 06/10/2009  . NAUSEA ALONE 06/10/2009  . BRONCHITIS 04/02/2009  .  DIABETES MELLITUS, TYPE I 03/22/2009  . HYPERLIPIDEMIA 03/22/2009  . HYPERTENSION 03/22/2009  . NECK PAIN, ACUTE 03/22/2009  . BACK PAIN, THORACIC REGION, LEFT 03/22/2009   Kerin Perna, PTA 09/20/16 9:35 AM  [place], [place], [address] Phone: [phone]   Fax:  [phone]  Name: Katherine Luna MRN: [id number] Date of Birth: 12-Jun-1955

## 2016-09-22 ENCOUNTER — Ambulatory Visit (INDEPENDENT_AMBULATORY_CARE_PROVIDER_SITE_OTHER): Payer: BC Managed Care – PPO | Admitting: Physical Therapy

## 2016-09-22 DIAGNOSIS — M545 Low back pain, unspecified: Secondary | ICD-10-CM

## 2016-09-22 DIAGNOSIS — M6281 Muscle weakness (generalized): Secondary | ICD-10-CM

## 2016-09-22 DIAGNOSIS — M25551 Pain in right hip: Secondary | ICD-10-CM | POA: Diagnosis not present

## 2016-09-22 DIAGNOSIS — R2689 Other abnormalities of gait and mobility: Secondary | ICD-10-CM

## 2016-09-22 NOTE — Therapy (Signed)
[facility] [address] [place], [place], [address] Phone: [phone]   Fax:  [phone]  Physical Therapy Treatment  Patient Details  Name: Katherine HailDawn M Menchaca MRN: [id number] Date of Birth: 1955/05/03 Referring Provider: Dr. Duwayne HeckJason Rogers  Encounter Date: 09/22/2016      PT End of Session - 09/22/16 0937    Visit Number 5   Number of Visits 16   Date for PT Re-Evaluation 10/31/16   Authorization Type BCBS   PT Start Time 0935  pt arrived late   PT Stop Time 1014   PT Time Calculation (min) 39 min   Equipment Utilized During Treatment Back brace   Activity Tolerance Patient tolerated treatment well   Behavior During Therapy [facility] for tasks assessed/performed      Past Medical History:  Diagnosis Date  . Diabetes mellitus without complication ([facility])   . Hyperlipemia   . Hypertension     Past Surgical History:  Procedure Laterality Date  . ABDOMINAL HYSTERECTOMY    . ABDOMINOPLASTY    . CESAREAN SECTION    . KNEE SURGERY      There were no vitals filed for this visit.      Subjective Assessment - 09/22/16 0938    Subjective Mandolin reports she was sore (5/10 pain) the afternoon of last visit. She took ibprofen to relieve pain. She did her exercises with 10# weights on her ankles yesterday without any pain.     Currently in Pain? No/denies   Pain Score 0-No pain            [facility] PT Assessment - 09/22/16 0001      Assessment   Medical Diagnosis fall off horse; Rt pubis fx, L3 compression fx, rib fxs   Referring Provider Dr. Duwayne HeckJason Rogers   Onset Date/Surgical Date 08/15/16   Next MD Visit 10/03/16     Strength   Overall Strength Comments tested in sitting due to TLSO and decreased mobility   Right Hip Flexion 4+/5  with pain in pubic area   Right Hip External Rotation  3+/5  pain   Right Hip Internal Rotation --  5-/5   Right Hip ABduction 4+/5   Right Hip ADduction --  5-/5            [facility]  Adult PT Treatment/Exercise - 09/22/16 0001      Self-Care   Self-Care Other Self-Care Comments   Other Self-Care Comments  Pt educated on anatomy in regards to her injury and how to not overdo it to allow her tissue/bone to heal properly.  Pt shown pics and given explanation.  Pt verbalized understanding.      Knee/Hip Exercises: Stretches   Gastroc Stretch Right;Left;2 reps;30 seconds   Other Knee/Hip Stretches seated, Rt leg hip abd/ER stretch (other leg hanging off EOB, foot on floor) x 30 sec x 2 reps.      Knee/Hip Exercises: Aerobic   Nustep L4 x 7 min, arms/legs     Knee/Hip Exercises: Standing   Side Lunges Right;15 reps;4 rep   Lateral Step Up Right;1 set;10 reps;Hand Hold: 1  3' step   Forward Step Up Right;Hand Hold: 1;1 set  3" step   SLS Rt SLS x 20 sec x 2 reps, then with toe taps front/side/back x 5 reps, 2 sets; one set on LLE.  Gentle Rt SLS with forward leans to touch hands to chair handles.   Other Standing Knee Exercises Lt SLS with Rt hamstring curl  x 10     Knee/Hip Exercises: Seated   Marching Both;1 set;10 reps   Marching Limitations the seated Rt hip abd/add x 10 to simulate driving.      Modalities   Modalities --  pt declined; will ice at home.                   PT Short Term Goals - 09/20/16 1610      PT SHORT TERM GOAL #1   Title independent with initial HEP   Time 4   Period Weeks   Status On-going     PT SHORT TERM GOAL #2   Title amb > 200' with SPC with supervision for improved functional mobiltiy   Time 4   Period Weeks   Status Achieved           PT Long Term Goals - 09/20/16 0857      PT LONG TERM GOAL #1   Title independent with advanced HEP   Time 8   Period Weeks   Status On-going     PT LONG TERM GOAL #2   Title amb > 500' on various indoor/outdoor surfaces independently without device for improved mobility and balance   Time 8   Period Weeks   Status On-going     PT LONG TERM GOAL #3   Title  negotiate at least 4 stairs without device or rail independently for improved mobility   Time 8   Period Weeks   Status On-going     PT LONG TERM GOAL #4   Title report pain < 3/10 with activity for improved pain   Time 8   Period Weeks   Status On-going     PT LONG TERM GOAL #5   Title demonstrate ability to step up at least 18" with 1 UE support in preparation for returning to riding horses and in order to safely mount a horse   Time 8   Period Weeks   Status On-going               Plan - 09/22/16 0954    Clinical Impression Statement Pt has made some improvements in Rt hip strength (tested in sitting due to back brace); continues to be limited in adduction, ER, flexion. She tolerated all exercise with slight increase in pain with stairs (3/10); resolved with rest.  Progressing towards goals.    Rehab Potential Good   PT Frequency 2x / week   PT Duration 8 weeks   PT Treatment/Interventions ADLs/Self Care Home Management;Canalith Repostioning;Cryotherapy;Electrical Stimulation;Moist Heat;Ultrasound;Neuromuscular re-education;Balance training;Therapeutic exercise;Therapeutic activities;Functional mobility training;Stair training;DME Instruction;Gait training;Patient/family education;Manual techniques;Passive range of motion;Vestibular;Taping;Dry needling   PT Next Visit Plan cont progressive LE strengthing, increasing RLE weight bearing to tolerance. Progress HEP.    Consulted and Agree with Plan of Care Patient      Patient will benefit from skilled therapeutic intervention in order to improve the following deficits and impairments:  Abnormal gait, Pain, Decreased strength, Decreased mobility, Difficulty walking, Decreased balance, Decreased knowledge of use of DME  Visit Diagnosis: Acute right-sided low back pain without sciatica  Pain in right hip  Muscle weakness (generalized)  Other abnormalities of gait and mobility     Problem List Patient Active Problem  List   Diagnosis Date Noted  . Syncope 08/16/2016  . ACUTE SINUSITIS, UNSPECIFIED 06/10/2009  . VERTIGO 06/10/2009  . SLEEP APNEA, CHRONIC 06/10/2009  . NAUSEA ALONE 06/10/2009  . BRONCHITIS 04/02/2009  . DIABETES MELLITUS, TYPE I 03/22/2009  .  HYPERLIPIDEMIA 03/22/2009  . HYPERTENSION 03/22/2009  . NECK PAIN, ACUTE 03/22/2009  . BACK PAIN, THORACIC REGION, LEFT 03/22/2009   Mayer Camel, PTA 09/22/16 11:42 AM  [facility] [place] [address] [place], [place], [address] Phone: [phone]   Fax:  [phone]  Name: MURL ZOGG MRN: [id number] Date of Birth: Apr 18, 1955

## 2016-09-26 ENCOUNTER — Ambulatory Visit (INDEPENDENT_AMBULATORY_CARE_PROVIDER_SITE_OTHER): Payer: BC Managed Care – PPO | Admitting: Physical Therapy

## 2016-09-26 DIAGNOSIS — M545 Low back pain, unspecified: Secondary | ICD-10-CM

## 2016-09-26 DIAGNOSIS — R2689 Other abnormalities of gait and mobility: Secondary | ICD-10-CM

## 2016-09-26 DIAGNOSIS — M6281 Muscle weakness (generalized): Secondary | ICD-10-CM

## 2016-09-26 DIAGNOSIS — M25551 Pain in right hip: Secondary | ICD-10-CM | POA: Diagnosis not present

## 2016-09-26 NOTE — Therapy (Signed)
[facility] [place] [address] [facility] [address] Phone: [phone]   Fax:  [phone]  Physical Therapy Treatment  Patient Details  Name: Katherine Luna MRN: [id number] Date of Birth: 1955/09/06 Referring Provider: Dr. Duwayne Heck  Encounter Date: 09/26/2016      PT End of Session - 09/26/16 0853    Visit Number 6   Number of Visits 16   Date for PT Re-Evaluation 10/31/16   Authorization Type BCBS   PT Start Time 0851   PT Stop Time 0939   PT Time Calculation (min) 48 min   Activity Tolerance Patient tolerated treatment well   Behavior During Therapy [facility] for tasks assessed/performed      Past Medical History:  Diagnosis Date  . Diabetes mellitus without complication ([facility])   . Hyperlipemia   . Hypertension     Past Surgical History:  Procedure Laterality Date  . ABDOMINAL HYSTERECTOMY    . ABDOMINOPLASTY    . CESAREAN SECTION    . KNEE SURGERY      There were no vitals filed for this visit.      Subjective Assessment - 09/26/16 0853    Subjective Pt reports she is a little sore in her Rt hamstring. She believes the weather is playing a role.  She is still taking ibprofen on days she has therapy.  She is using pain as guide with HEP.  "It's getting a little better every day".     Pertinent History HTN, DM, HLD   Patient Stated Goals return to riding horses   Currently in Pain? Yes   Pain Score 3    Pain Location Pelvis   Pain Orientation Right   Aggravating Factors  walking, standing    Pain Relieving Factors sitting, ibprofen            [facility] PT Assessment - 09/26/16 0001      Assessment   Medical Diagnosis fall off horse; Rt pubis fx, L3 compression fx, rib fxs   Referring Provider Dr. Duwayne Heck   Onset Date/Surgical Date 08/15/16   Next MD Visit 10/03/16     Precautions   Precautions Back   Required Braces or Orthoses Spinal Brace   Spinal Brace Thoracolumbosacral  orthotic;Applied in sitting position          [facility] Adult PT Treatment/Exercise - 09/26/16 0001      Knee/Hip Exercises: Stretches   Passive Hamstring Stretch Right;Left;2 reps;60 seconds   Piriformis Stretch Right;Left;2 reps;60 seconds   Gastroc Stretch Right;Left;2 reps;30 seconds     Knee/Hip Exercises: Standing   Heel Raises Both;2 sets;10 reps   Lateral Step Up Right;1 set;20 reps;Hand Hold: 2;Step Height: 6"   Forward Step Up Right;1 set;20 reps;Hand Hold: 2;Step Height: 6"   Functional Squat 10 reps  (first 5 with dowel against back)   Stairs 5 stairs (2- 6", 3 - 3") x 2 reps without rail, close SBA - unsteady with 6" steps.    Other Standing Knee Exercises squats with buttocks to Nautilus seat (to simulate posting on horse) with eccentric lowering x 10;  Lt SLS with Rt hip /knee flexion (high) to simulate mounting horse x 15 reps; SLS on blue pad with opp toe taps front/side/back x 10 reps each leg.    Other Standing Knee Exercises tandem stance with feet on blue pads x 30 sec x 2 reps each foot forward  PT Education - 09/26/16 0908    Education provided Yes   Education Details Info on TENS.    Person(s) Educated Patient   Methods Explanation;Handout   Comprehension Verbalized understanding          PT Short Term Goals - 09/26/16 0934      PT SHORT TERM GOAL #1   Title independent with initial HEP   Time 4   Period Weeks   Status Achieved     PT SHORT TERM GOAL #2   Title amb > 200' with SPC with supervision for improved functional mobiltiy   Time 4   Period Weeks   Status Achieved           PT Long Term Goals - 09/26/16 0934      PT LONG TERM GOAL #1   Title independent with advanced HEP   Time 8   Period Weeks   Status On-going     PT LONG TERM GOAL #2   Title amb > 500' on various indoor/outdoor surfaces independently without device for improved mobility and balance   Time 8   Period Weeks   Status On-going     PT  LONG TERM GOAL #3   Title negotiate at least 4 stairs without device or rail independently for improved mobility   Time 8   Period Weeks   Status On-going     PT LONG TERM GOAL #4   Title report pain < 3/10 with activity for improved pain   Time 8   Period Weeks   Status On-going     PT LONG TERM GOAL #5   Title demonstrate ability to step up at least 18" with 1 UE support in preparation for returning to riding horses and in order to safely mount a horse   Time 8   Period Weeks   Status On-going               Plan - 09/26/16 1129    Clinical Impression Statement Pt tolerated all exercises today without increase in pain. She is able to go up/down 6" stair with UE support without pain now; improvement from last week.   Pt making good gains each visit.     Rehab Potential Good   PT Frequency 2x / week   PT Duration 8 weeks   PT Treatment/Interventions ADLs/Self Care Home Management;Canalith Repostioning;Cryotherapy;Electrical Stimulation;Moist Heat;Ultrasound;Neuromuscular re-education;Balance training;Therapeutic exercise;Therapeutic activities;Functional mobility training;Stair training;DME Instruction;Gait training;Patient/family education;Manual techniques;Passive range of motion;Vestibular;Taping;Dry needling   PT Next Visit Plan cont progressive LE strengthing, add spine stabilization exercises. Progress HEP.    Consulted and Agree with Plan of Care Patient      Patient will benefit from skilled therapeutic intervention in order to improve the following deficits and impairments:  Abnormal gait, Pain, Decreased strength, Decreased mobility, Difficulty walking, Decreased balance, Decreased knowledge of use of DME  Visit Diagnosis: Acute right-sided low back pain without sciatica  Pain in right hip  Muscle weakness (generalized)  Other abnormalities of gait and mobility     Problem List Patient Active Problem List   Diagnosis Date Noted  . Syncope 08/16/2016  .  ACUTE SINUSITIS, UNSPECIFIED 06/10/2009  . VERTIGO 06/10/2009  . SLEEP APNEA, CHRONIC 06/10/2009  . NAUSEA ALONE 06/10/2009  . BRONCHITIS 04/02/2009  . DIABETES MELLITUS, TYPE I 03/22/2009  . HYPERLIPIDEMIA 03/22/2009  . HYPERTENSION 03/22/2009  . NECK PAIN, ACUTE 03/22/2009  . BACK PAIN, THORACIC REGION, LEFT 03/22/2009   Mayer Camel, PTA 09/26/16 11:31 AM  [facility] [address] [place], [place], [address] Phone: [phone]   Fax:  [phone]  Name: Donnetta HailDawn M Ridinger MRN: [id number] Date of Birth: June 27, 1955

## 2016-09-26 NOTE — Patient Instructions (Signed)
TENS UNIT: This is helpful for muscle pain and spasm.   Search and Purchase a TENS 7000 2nd edition at www.tenspros.com. It should be less than $30.   Or go to [email or web address]     TENS unit instructions: Do not shower or bathe with the unit on Turn the unit off before removing electrodes or batteries If the electrodes lose stickiness add a drop of water to the electrodes after they are disconnected from the unit and place on plastic sheet. If you continued to have difficulty, call the TENS unit company to purchase more electrodes. Do not apply lotion on the skin area prior to use. Make sure the skin is clean and dry as this will help prolong the life of the electrodes. After use, always check skin for unusual red areas, rash or other skin difficulties. If there are any skin problems, does not apply electrodes to the same area. Never remove the electrodes from the unit by pulling the wires. Do not use the TENS unit or electrodes other than as directed. Do not change electrode placement without consultating your therapist or physician. Keep 2 fingers with between each electrode. Wear time ratio is 2:1, on to off times.    For example on for 30 minutes off for 15 minutes and then on for 30 minutes off for 15 minutes

## 2016-09-29 ENCOUNTER — Ambulatory Visit (INDEPENDENT_AMBULATORY_CARE_PROVIDER_SITE_OTHER): Payer: BC Managed Care – PPO | Admitting: Physical Therapy

## 2016-09-29 DIAGNOSIS — M25551 Pain in right hip: Secondary | ICD-10-CM | POA: Diagnosis not present

## 2016-09-29 DIAGNOSIS — M545 Low back pain, unspecified: Secondary | ICD-10-CM

## 2016-09-29 DIAGNOSIS — M6281 Muscle weakness (generalized): Secondary | ICD-10-CM

## 2016-09-29 NOTE — Therapy (Signed)
[facility] [place], [place], [address] Phone: [phone]   Fax:  [phone]  Physical Therapy Treatment  Patient Details  Name: Katherine Luna MRN: [id number] Date of Birth: February 15, 1955 Referring Provider: Dr. Victorino December  Encounter Date: 09/29/2016      PT End of Session - 09/29/16 0930    Visit Number 7   Number of Visits 16   Date for PT Re-Evaluation 10/31/16   Authorization Type [employer]   PT Start Time [phone]  pt arrived late   PT Stop Time 0955   PT Time Calculation (min) 60 min   Equipment Utilized During Treatment Back brace   Activity Tolerance Patient tolerated treatment well   Behavior During Therapy [facility] for tasks assessed/performed      Past Medical History:  Diagnosis Date  . Diabetes mellitus without complication ([place])   . Hyperlipemia   . Hypertension     Past Surgical History:  Procedure Laterality Date  . ABDOMINAL HYSTERECTOMY    . ABDOMINOPLASTY    . CESAREAN SECTION    . KNEE SURGERY      There were no vitals filed for this visit.      Subjective Assessment - 09/29/16 0900    Subjective Katherine Luna reports she felt pretty good after last session; didn't even need to ice afterwards.  Her knees are bothering her; feels it is related to weather.   She's been having some Rt low back spasms when out of brace at home.    Pertinent History HTN, DM, HLD   Patient Stated Goals return to riding horses   Currently in Pain? No/denies   Pain Score --  up to 3-4/10 with squatting. 0/10 at rest.    Pain Location Knee   Pain Orientation Right;Left   Pain Relieving Factors ibprofen             [facility] PT Assessment - 09/29/16 0001      Assessment   Medical Diagnosis fall off horse; Rt pubis fx, L3 compression fx, rib fxs   Referring Provider Dr. Victorino December   Onset Date/Surgical Date 08/15/16   Next MD Visit 10/03/16   Prior Therapy in acute only     Strength   Overall Strength  Comments tested in sitting due to TLSO and decreased mobility   Right Hip Flexion --  5-/5   Right Hip External Rotation  3+/5   Right Hip Internal Rotation 4-/5   Right Hip ABduction 4/5   Right Hip ADduction 4+/5   Left Hip Flexion 5/5   Left Hip External Rotation 4/5   Left Hip Internal Rotation 5/5           [facility] Adult PT Treatment/Exercise - 09/29/16 0001      Self-Care   Self-Care Other Self-Care Comments   Other Self-Care Comments  Pt educated on use of TENS unit - parameters and application.  Pt verbalized understanding     Knee/Hip Exercises: Stretches   Passive Hamstring Stretch Right;Left;2 reps;30 seconds  supine with strap   Quad Stretch Right;Left;1 rep;30 seconds  in prone   Quad Stretch Limitations trial in sidelying increased back discomfort   Other Knee/Hip Stretches butterfly stretch x 30 sec x 2 reps     Knee/Hip Exercises: Aerobic   Nustep L4 x 5 min, L5 x 2 min .      Knee/Hip Exercises: Standing   Forward Step Up Right;Left;1 set;10 reps;Hand Hold: 2  onto Bosu  SLS Rt/Lt SLS on blue pad with toe taps front/side/back x 12 reps; SLS on bosu x 15-30 sec x 2 reps each leg, occasional UE support    Other Standing Knee Exercises squats with buttocks to Nautilus seat (to simulate posting on horse) with eccentric lowering x 10, 2 sets.      Knee/Hip Exercises: Seated   Other Seated Knee/Hip Exercises lateral side/side pelvic tilts on dynadisc (to tolerance); Core isometrics while sitting on dynadisc, resistance of palms against PTA's hands in downward/upward direction, and anti-rotation x 5 sec x 5 reps each direction   Marching Both;1 set;15 reps  sitting on dynadisc                  PT Short Term Goals - 09/26/16 0934      PT SHORT TERM GOAL #1   Title independent with initial HEP   Time 4   Period Weeks   Status Achieved     PT SHORT TERM GOAL #2   Title amb > 200' with SPC with supervision for improved functional mobiltiy   Time 4    Period Weeks   Status Achieved           PT Long Term Goals - 09/29/16 0945      PT LONG TERM GOAL #1   Title independent with advanced HEP   Time 8   Period Weeks   Status On-going     PT LONG TERM GOAL #2   Title amb > 500' on various indoor/outdoor surfaces independently without device for improved mobility and balance   Time 8   Period Weeks   Status Partially Met  able to ambulate on even indoor surfaces independently.      PT LONG TERM GOAL #3   Title negotiate at least 4 stairs without device or rail independently for improved mobility   Time 8   Period Weeks   Status On-going  improving     PT LONG TERM GOAL #4   Title report pain < 3/10 with activity for improved pain   Time 8   Period Weeks   Status On-going     PT LONG TERM GOAL #5   Title demonstrate ability to step up at least 18" with 1 UE support in preparation for returning to riding horses and in order to safely mount a horse   Time 8   Period Weeks   Status On-going               Plan - 09/29/16 1008    Clinical Impression Statement Katherine Luna tolerated all exercises well, while maintaining back precautions.  She was able to perform Rt SLS without any production of symptoms today.  Her hip strength has improved significantly from eval (tested in sitting due to back brace).  Pt has met STG and partially met LTG # 2.     Rehab Potential Good   PT Frequency 2x / week   PT Duration 8 weeks   PT Treatment/Interventions ADLs/Self Care Home Management;Canalith Repostioning;Cryotherapy;Electrical Stimulation;Moist Heat;Ultrasound;Neuromuscular re-education;Balance training;Therapeutic exercise;Therapeutic activities;Functional mobility training;Stair training;DME Instruction;Gait training;Patient/family education;Manual techniques;Passive range of motion;Vestibular;Taping;Dry needling   PT Next Visit Plan cont progressive LE strengthing, add spine stabilization exercises. Progress HEP.    Consulted and  Agree with Plan of Care Patient      Patient will benefit from skilled therapeutic intervention in order to improve the following deficits and impairments:  Abnormal gait, Pain, Decreased strength, Decreased mobility, Difficulty walking, Decreased balance, Decreased knowledge  of use of DME  Visit Diagnosis: Acute right-sided low back pain without sciatica  Pain in right hip  Muscle weakness (generalized)     Problem List Patient Active Problem List   Diagnosis Date Noted  . Syncope 08/16/2016  . ACUTE SINUSITIS, UNSPECIFIED 06/10/2009  . VERTIGO 06/10/2009  . SLEEP APNEA, CHRONIC 06/10/2009  . NAUSEA ALONE 06/10/2009  . BRONCHITIS 04/02/2009  . DIABETES MELLITUS, TYPE I 03/22/2009  . HYPERLIPIDEMIA 03/22/2009  . HYPERTENSION 03/22/2009  . NECK PAIN, ACUTE 03/22/2009  . BACK PAIN, THORACIC REGION, LEFT 03/22/2009   Kerin Perna, PTA 09/29/16 10:16 AM  [facility] [place], [place], [address] Phone: [phone]   Fax:  [phone]  Name: Katherine Luna MRN: [id number] Date of Birth: 01-01-1956

## 2016-10-04 ENCOUNTER — Ambulatory Visit (INDEPENDENT_AMBULATORY_CARE_PROVIDER_SITE_OTHER): Payer: BC Managed Care – PPO | Admitting: Physical Therapy

## 2016-10-04 DIAGNOSIS — M6281 Muscle weakness (generalized): Secondary | ICD-10-CM

## 2016-10-04 DIAGNOSIS — M25551 Pain in right hip: Secondary | ICD-10-CM

## 2016-10-04 DIAGNOSIS — M545 Low back pain, unspecified: Secondary | ICD-10-CM

## 2016-10-04 DIAGNOSIS — R2689 Other abnormalities of gait and mobility: Secondary | ICD-10-CM

## 2016-10-04 NOTE — Therapy (Signed)
Katherine Luna, [place], [address] Phone: [phone]   Fax:  [phone]  Physical Therapy Treatment  Patient Details  Name: Katherine Luna MRN: [id number] Date of Birth: 1955-02-10 Referring Provider: Dr Victorino December  Encounter Date: 10/04/2016      PT End of Session - 10/04/16 0851    Visit Number 8   Date for PT Re-Evaluation 10/31/16   PT Start Time 0849   PT Stop Time 0929   PT Time Calculation (min) 40 min   Activity Tolerance Patient tolerated treatment well      Past Medical History:  Diagnosis Date  . Diabetes mellitus without complication ([place])   . Hyperlipemia   . Hypertension     Past Surgical History:  Procedure Laterality Date  . ABDOMINAL HYSTERECTOMY    . ABDOMINOPLASTY    . CESAREAN SECTION    . KNEE SURGERY      There were no vitals filed for this visit.      Subjective Assessment - 10/04/16 0850    Subjective Pt reports she saw her MD yesterday and he told her today can be her last PT session, he doesn't think she needs to come anymore.    Patient Stated Goals return to riding horses   Currently in Pain? No/denies            [facility] PT Assessment - 10/04/16 0001      Assessment   Medical Diagnosis fall off horse; Rt pubis fx, L3 compression fx, rib fxs   Referring Provider Dr Victorino December   Onset Date/Surgical Date 08/15/16     Precautions   Spinal Brace Thoracolumbosacral orthotic;Applied in sitting position     Observation/Other Assessments   Focus on Therapeutic Outcomes (FOTO)  44% limited                     [facility] Adult PT Treatment/Exercise - 10/04/16 0001      Knee/Hip Exercises: Stretches   Active Hamstring Stretch Both;3 reps;30 seconds  with bilat quad contraction   Other Knee/Hip Stretches ITB stretch cross body with strap     Knee/Hip Exercises: Aerobic   Nustep L4 x 5 min, L5 x 2 min .      Knee/Hip Exercises: Standing   Other Standing Knee Exercises 10 reps single leg sit to stand on high table     Knee/Hip Exercises: Supine   Bridges Limitations 2x10   Other Supine Knee/Hip Exercises 10 reps TA contractions then with marching, single leg clams, heel slides.                   PT Short Term Goals - 09/26/16 0934      PT SHORT TERM GOAL #1   Title independent with initial HEP   Time 4   Period Weeks   Status Achieved     PT SHORT TERM GOAL #2   Title amb > 200' with SPC with supervision for improved functional mobiltiy   Time 4   Period Weeks   Status Achieved           PT Long Term Goals - 10/04/16 0908      PT LONG TERM GOAL #1   Title independent with advanced HEP   Status Achieved     PT LONG TERM GOAL #2   Title amb > 500' on various indoor/outdoor surfaces independently without device for improved mobility and balance   Status Achieved  PT LONG TERM GOAL #3   Title negotiate at least 4 stairs without device or rail independently for improved mobility   Status Partially Met  occasionaly requires rail assist     PT LONG TERM GOAL #4   Title report pain < 3/10 with activity for improved pain   Status Achieved     PT LONG TERM GOAL #5   Title demonstrate ability to step up at least 18" with 1 UE support in preparation for returning to riding horses and in order to safely mount a horse   Status Not Met               Plan - 10/04/16 [place] continues to do very well with her HEP.  Her referring MD is pleased with her progress and informed her she doesn't need to continue with therapy.  She does have weakness in her core, tolereated initial exercise for this. Pt sees her surgeon the middle of October and if she is still having core issues she may return to PT to address this.    PT Next Visit Plan discharge to HEP per referring MD.    Consulted and Agree with Plan of Care Patient      Patient will benefit from skilled  therapeutic intervention in order to improve the following deficits and impairments:     Visit Diagnosis: Acute right-sided low back pain without sciatica  Pain in right hip  Muscle weakness (generalized)  Other abnormalities of gait and mobility     Problem List Patient Active Problem List   Diagnosis Date Noted  . Syncope 08/16/2016  . ACUTE SINUSITIS, UNSPECIFIED 06/10/2009  . VERTIGO 06/10/2009  . SLEEP APNEA, CHRONIC 06/10/2009  . NAUSEA ALONE 06/10/2009  . BRONCHITIS 04/02/2009  . DIABETES MELLITUS, TYPE I 03/22/2009  . HYPERLIPIDEMIA 03/22/2009  . HYPERTENSION 03/22/2009  . NECK PAIN, ACUTE 03/22/2009  . BACK PAIN, THORACIC REGION, LEFT 03/22/2009    [name] shaver PT  10/04/2016, 10:15 AM  [facility] [place], [place], [address] Phone: [phone]   Fax:  [phone]  Name: Katherine Luna MRN: [id number] Date of Birth: Jul 18, 1955   PHYSICAL THERAPY DISCHARGE SUMMARY  Visits from Start of Care:8  Current functional level related to goals / functional outcomes: See above for function related to goals   Remaining deficits: Some LE and core weakness, pain is not an issue at this time.  She continues to wear her back brace.    Education / Equipment: HEP  Plan: Patient agrees to discharge.  Patient goals were partially met. Patient is being discharged due to the physician's request.  ?????    Jeral Pinch, PT 10/04/16 10:17 AM

## 2016-10-04 NOTE — Patient Instructions (Addendum)
Abdominal Bracing With Pelvic Floor (Hook-Lying)   With neutral spine, tighten pelvic floor and abdominals. Hold 10 seconds. Repeat __10_ times. Do _1__ times a day.  Knee to Chest: Transverse Plane Stability - slowly    Bring one knee up, then return. Be sure pelvis does not roll side to side. Keep pelvis still. Lift knee __10_ times each leg. Restabilize pelvis. Repeat with other leg. Do _1-2__ sets, _1__ times per day.  Hip External Rotation With Pillow: Transverse Plane Stability   One knee bent, one leg straight, on pillow. Slowly roll bent knee out. Be sure pelvis does not rotate. Do _10__ times. Restabilize pelvis. Repeat with other leg. Do _1-2__ sets, _1__ times per day.  Heel Slide: 4-10 Inches - Transverse Plane Stability   Slide heel 4 inches down. Be sure pelvis does not rotate. Do _10__ times. Restabilize pelvis. Repeat with other leg. Do __1_ sets, _1__ times per day.  Quad Strength: Single-Leg Quarter Squat - use a chair or couch arm rest.     Standing on one leg, slowly lower down to a chair or high surface.  Push through heel and stand back up. Repeat __10-20__ times, do __1__ sessions per day. CAUTION: You should not bend knee deep enough to cause pain.  Bridge    Lie back, legs bent. Inhale, pressing hips up. Keeping ribs in, lengthen lower back. Exhale, rolling down along spine from top. Repeat __10-20__ times. Do _1___ sessions per day.  When this is easy, lift one leg up and perform single leg.

## 2016-10-07 ENCOUNTER — Encounter: Payer: BC Managed Care – PPO | Admitting: Physical Therapy

## 2019-05-16 IMAGING — DX DG HIP (WITH OR WITHOUT PELVIS) 3-4V BILAT
5 series · 5 of 5 positions shown · non-contrast
Comparison: None.

CLINICAL DATA: horse's head collided with her own and knocked her
out today, causing her to fall from the horse.

Pt c/o lower right side chest pain, posterior right hip pain and low
back pain.
Hx of HTN, DM.
No hx of lumbar or pelvic injuries or surgeries.
EXAM:
DG HIP (WITH OR WITHOUT PELVIS) 3-4V BILAT

[pelvis ap]
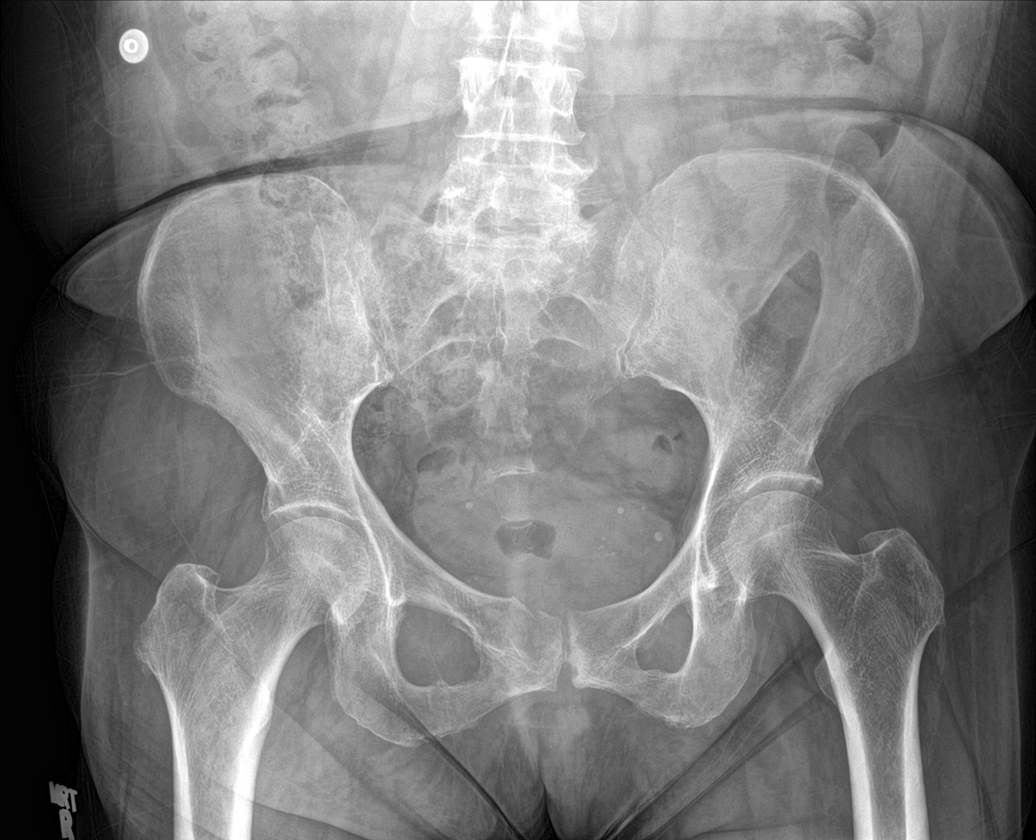

[hip ap (1 of 2)]
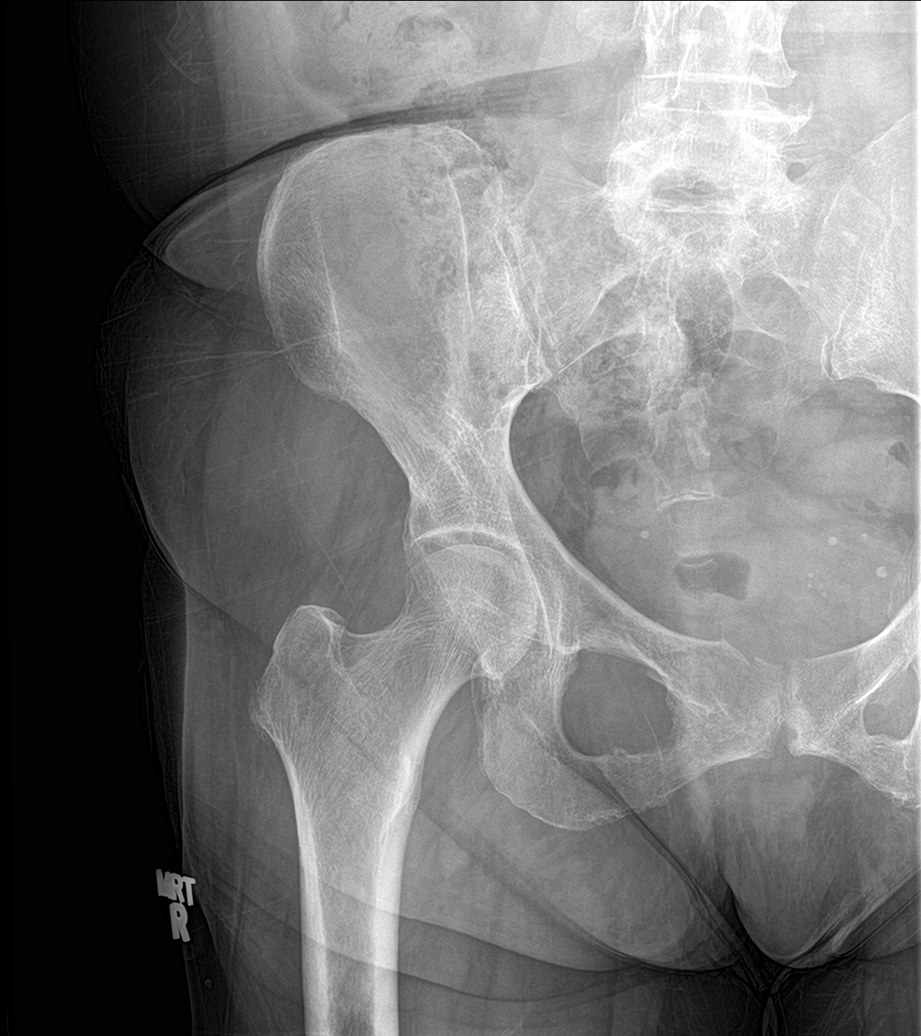

[hip lat (1 of 2)]
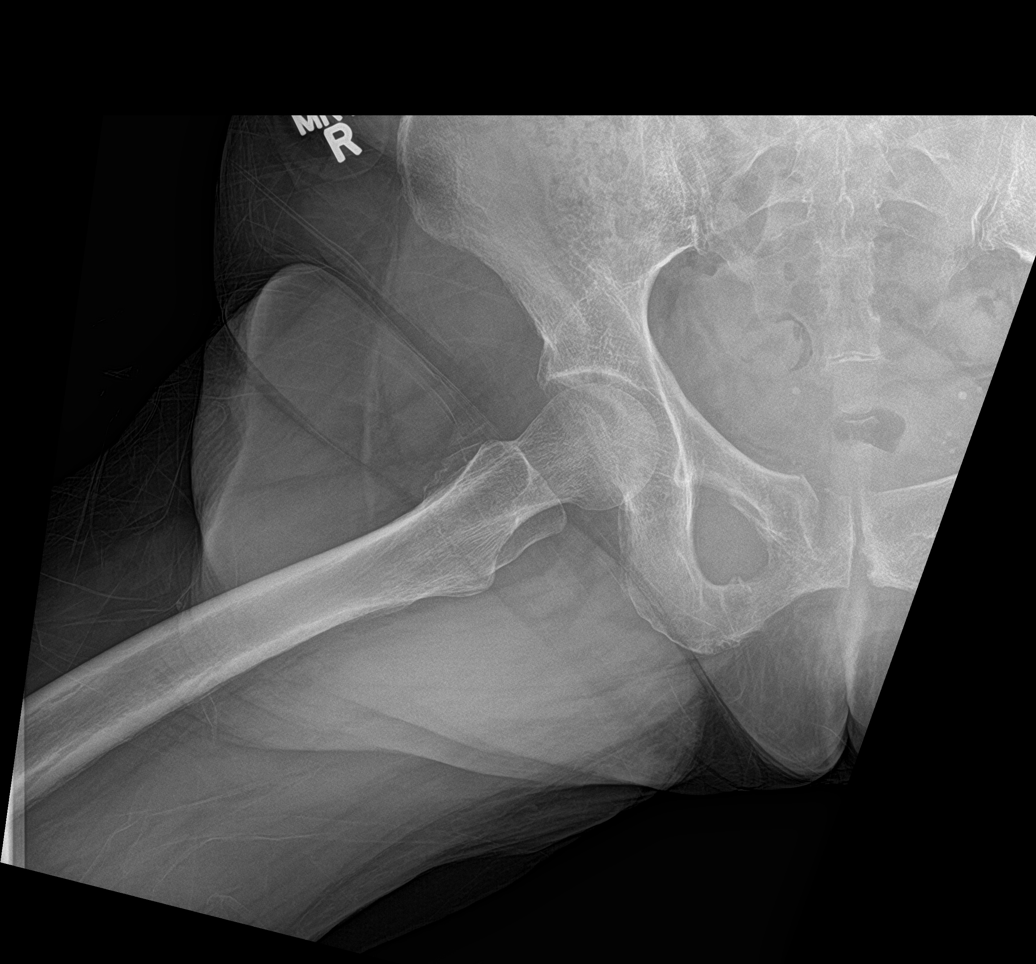

[hip ap (2 of 2)]
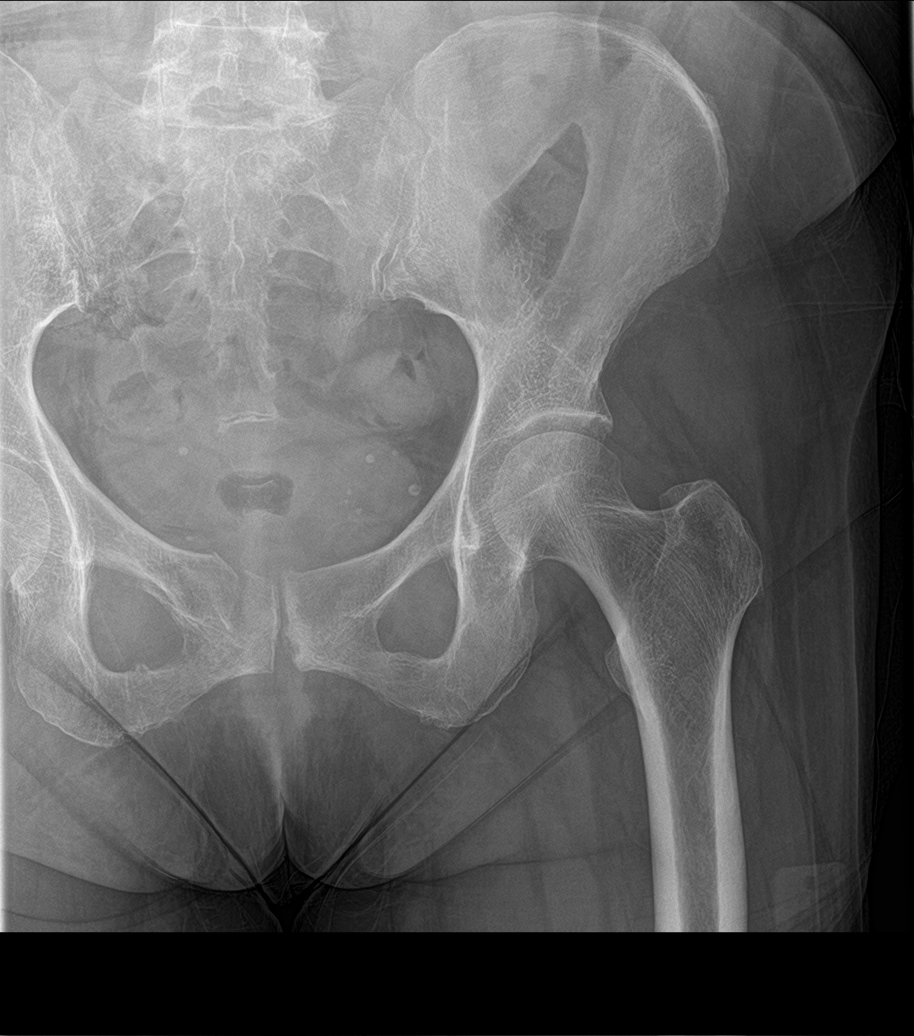

[hip lat (2 of 2)]
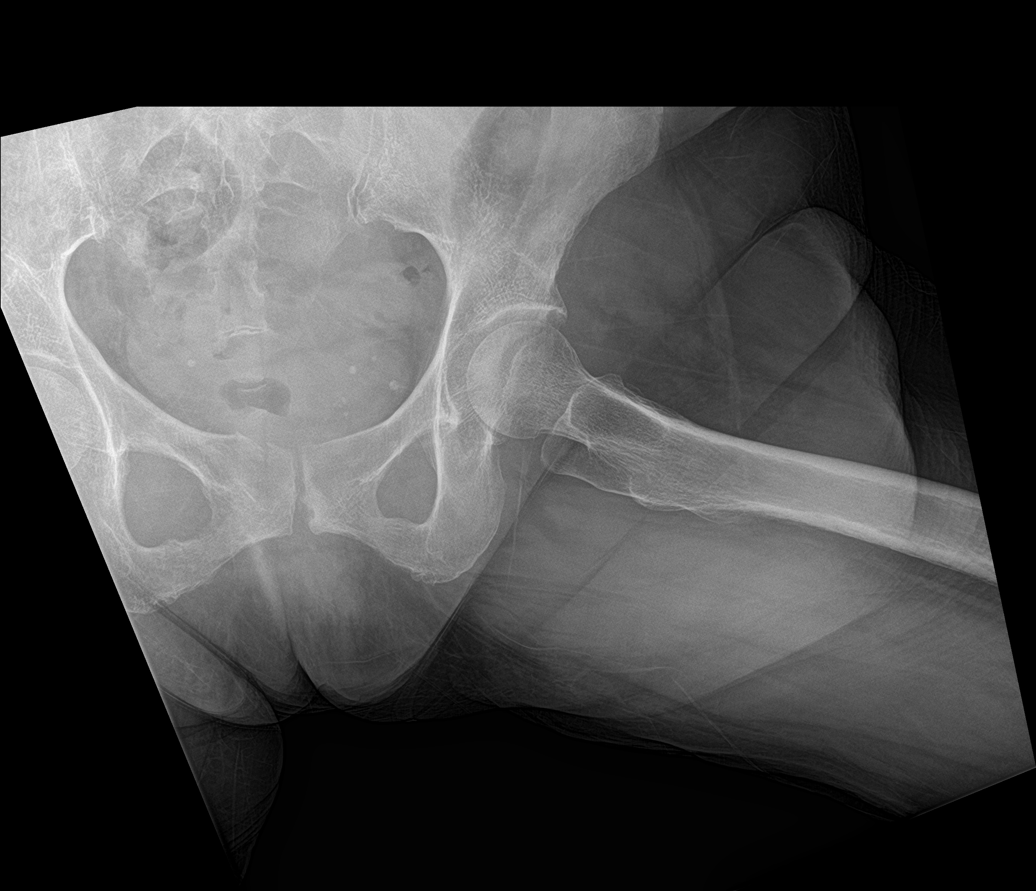

[5 of 5 positions shown; findings below may reference images not displayed]

FINDINGS: Minimally comminuted minimally displaced fracture of the right pubic
bone, involving the superior and inferior pubic rami. No other
pelvic ring fracture. Bilateral hips unremarkable. Bilateral pelvic
phleboliths incidentally noted.
IMPRESSION: 1. Minimally displaced right pubic bone fracture involving superior
and inferior rami.

## 2019-05-17 IMAGING — CR DG RIBS 2V*R*
4 series · 4 of 4 positions shown · non-contrast
Comparison: 08/16/2016

CLINICAL DATA: Post fall from horse yesterday now with right
lateral rib pain.

EXAM:
RIGHT RIBS - 2 VIEW

[rib ap (1 of 3)]
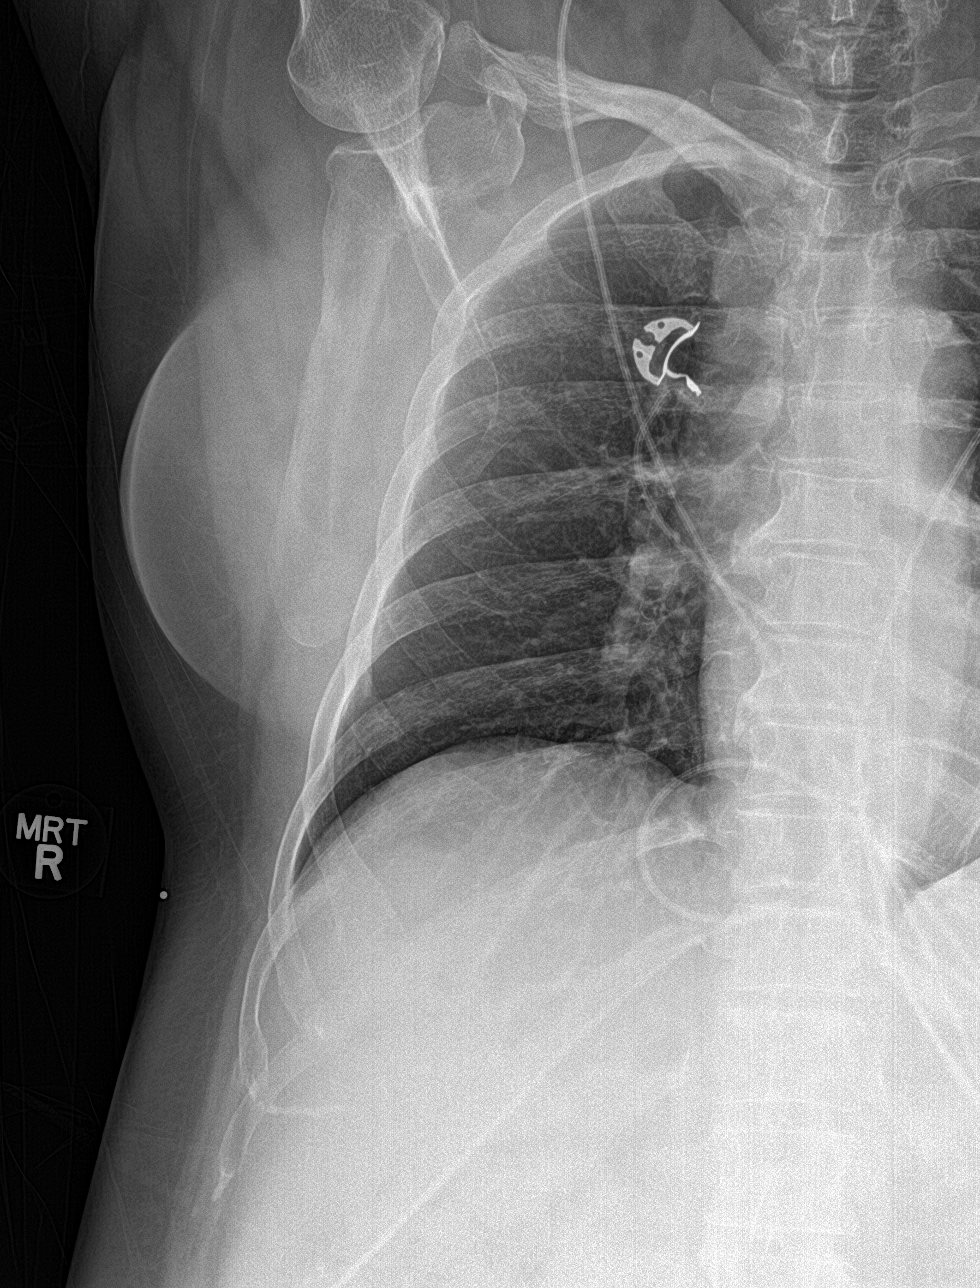

[rib ap obl]
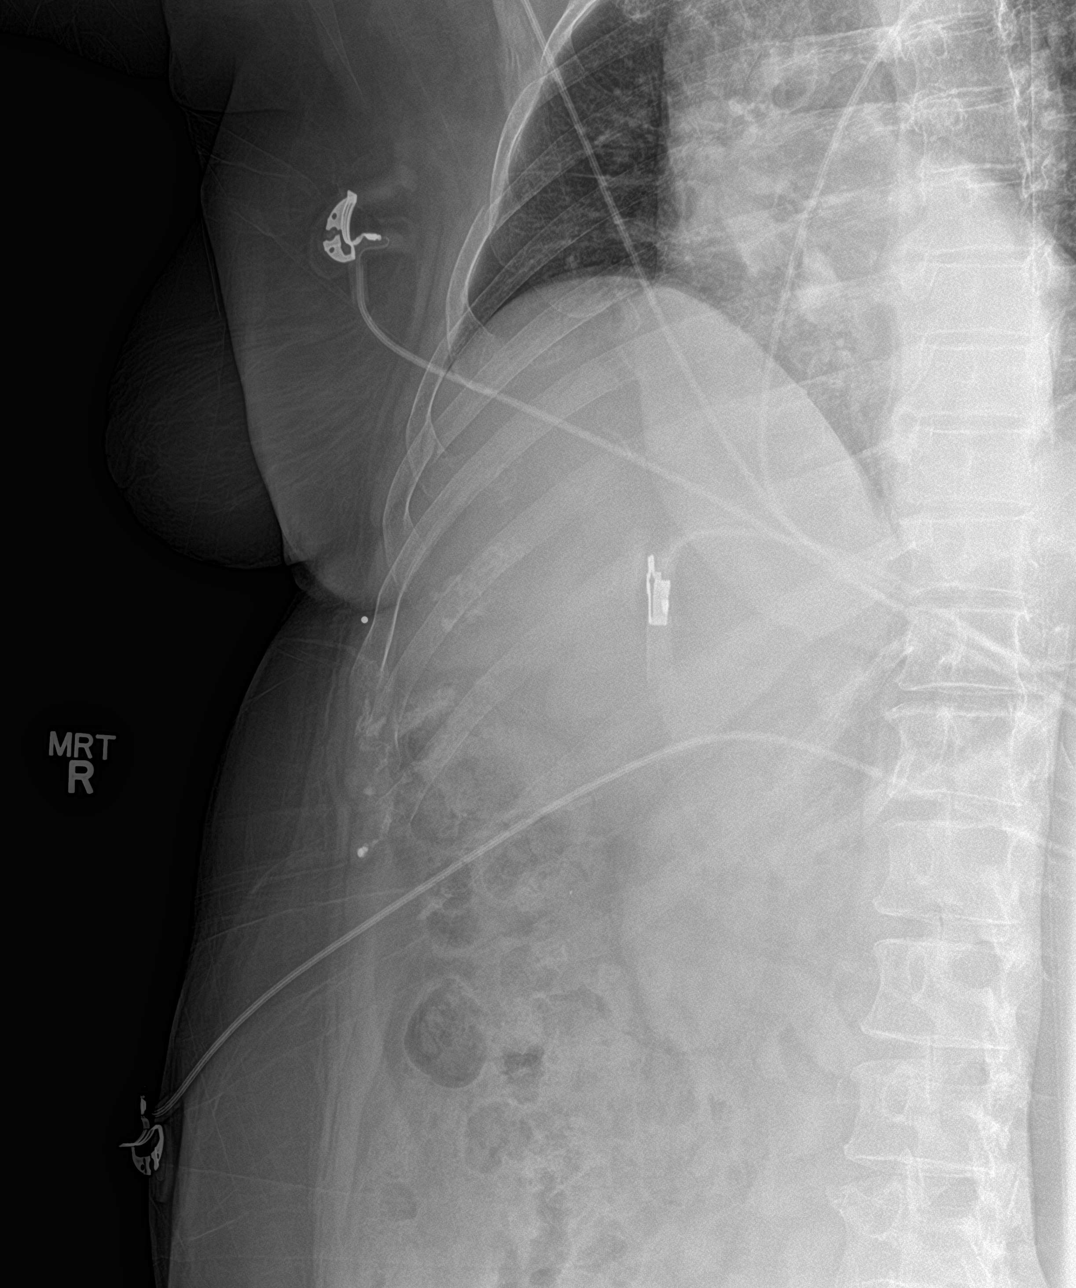

[rib ap (2 of 3)]
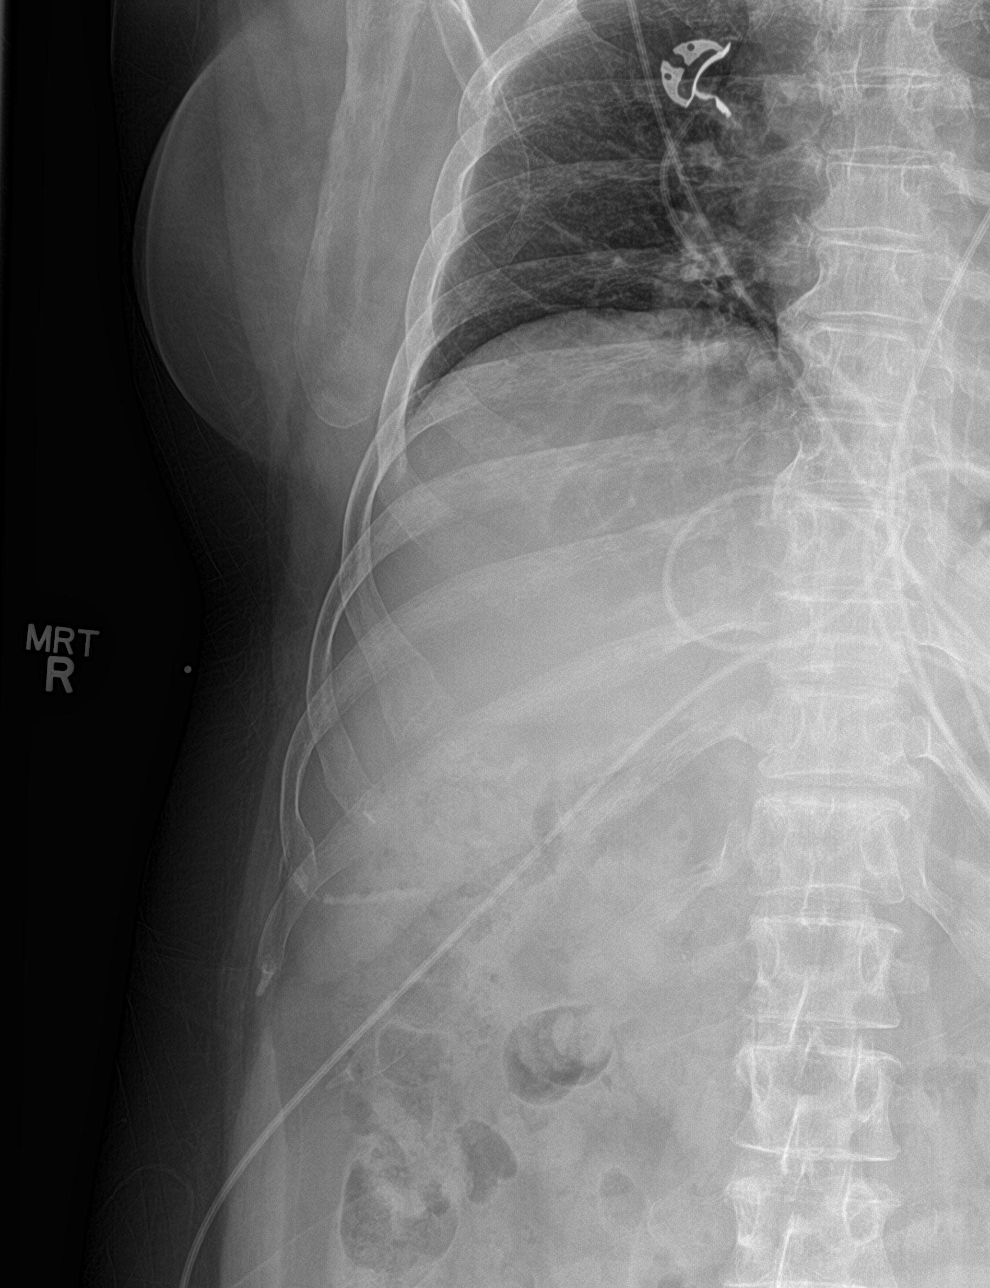

[rib ap (3 of 3)]
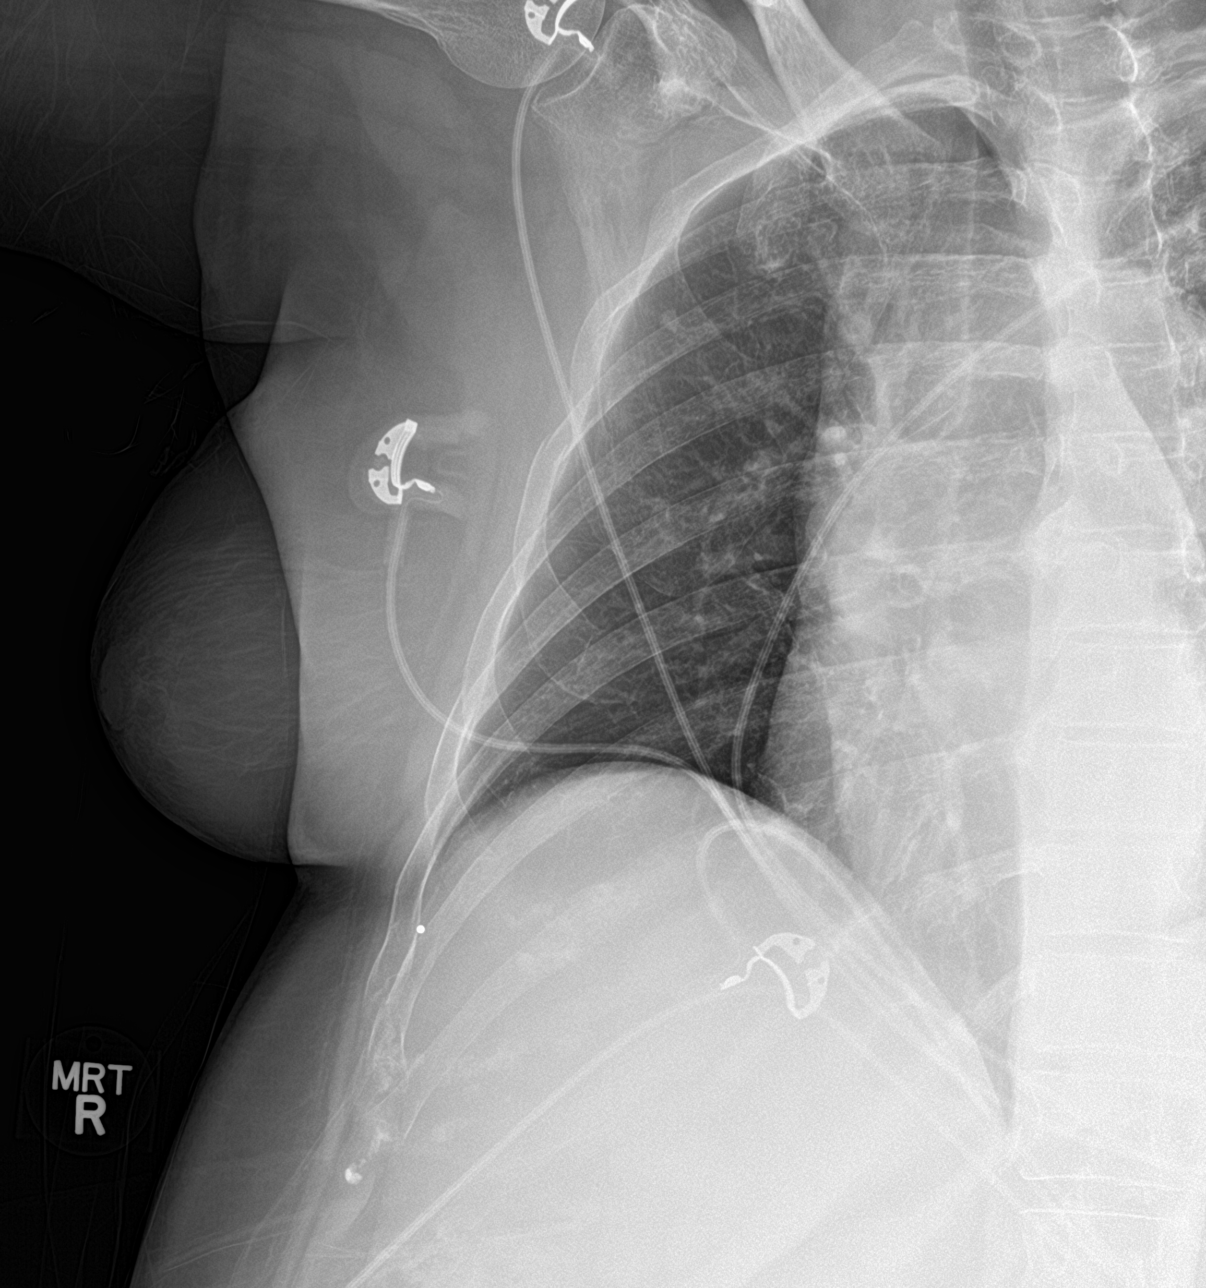

[4 of 4 positions shown; findings below may reference images not displayed]

FINDINGS: Acute, minimally displaced fractures involving the posterolateral
aspects of the right fourth and sixth ribs. No associated pleural
effusion or pneumothorax. Regional soft tissues appear normal. No
radiopaque foreign body.
IMPRESSION: Acute minimally displaced fractures involving the right 4th and 6th
ribs with associated pleural effusion, pneumothorax or radiopaque
foreign body.
